# Patient Record
Sex: Female | Born: 1980 | Hispanic: Yes | Marital: Married | Smoking: Never smoker
Health system: Southern US, Community
[De-identification: ages and names within clinical notes are randomized; demographics above are authoritative.]

## PROBLEM LIST (undated history)

## (undated) DIAGNOSIS — K219 Gastro-esophageal reflux disease without esophagitis: Secondary | ICD-10-CM

## (undated) HISTORY — DX: Gastro-esophageal reflux disease without esophagitis: K21.9

## (undated) HISTORY — PX: OTHER SURGICAL HISTORY: SHX169

---

## 2013-08-23 ENCOUNTER — Other Ambulatory Visit (HOSPITAL_COMMUNITY): Payer: Self-pay | Admitting: Nurse Practitioner

## 2013-08-23 DIAGNOSIS — N644 Mastodynia: Secondary | ICD-10-CM

## 2013-08-23 DIAGNOSIS — N63 Unspecified lump in unspecified breast: Secondary | ICD-10-CM

## 2013-09-01 ENCOUNTER — Ambulatory Visit (HOSPITAL_COMMUNITY)
Admission: RE | Admit: 2013-09-01 | Discharge: 2013-09-01 | Disposition: A | Discharge status: Home / Self Care | Payer: Medicaid Other | Source: Ambulatory Visit | Attending: Nurse Practitioner | Admitting: Nurse Practitioner

## 2013-09-01 DIAGNOSIS — N63 Unspecified lump in unspecified breast: Secondary | ICD-10-CM

## 2013-09-01 DIAGNOSIS — N644 Mastodynia: Secondary | ICD-10-CM | POA: Insufficient documentation

## 2017-08-08 DIAGNOSIS — Z3682 Encounter for antenatal screening for nuchal translucency: Secondary | ICD-10-CM | POA: Insufficient documentation

## 2017-08-08 DIAGNOSIS — Z3A13 13 weeks gestation of pregnancy: Secondary | ICD-10-CM | POA: Insufficient documentation

## 2019-02-10 ENCOUNTER — Ambulatory Visit (INDEPENDENT_AMBULATORY_CARE_PROVIDER_SITE_OTHER): Payer: Self-pay | Admitting: Nurse Practitioner

## 2019-05-12 ENCOUNTER — Encounter: Payer: Self-pay | Admitting: Internal Medicine

## 2019-06-02 ENCOUNTER — Other Ambulatory Visit: Payer: Self-pay

## 2019-06-02 ENCOUNTER — Encounter: Payer: Self-pay | Admitting: Gastroenterology

## 2019-06-02 ENCOUNTER — Encounter: Payer: Self-pay | Admitting: Internal Medicine

## 2019-06-02 ENCOUNTER — Ambulatory Visit (INDEPENDENT_AMBULATORY_CARE_PROVIDER_SITE_OTHER): Payer: Self-pay | Admitting: Gastroenterology

## 2019-06-02 DIAGNOSIS — R1013 Epigastric pain: Secondary | ICD-10-CM | POA: Insufficient documentation

## 2019-06-02 MED ORDER — DEXLANSOPRAZOLE 60 MG PO CPDR: 60.0000 mg | DELAYED_RELEASE_CAPSULE | Freq: Every day | ORAL | 3 refills | Status: DC

## 2019-06-02 NOTE — H&P (View-Only) (Signed)
Primary Care Physician:  Raiford Simmonds., PA-C Primary Gastroenterologist:  Dr. Gala Romney   Chief Complaint  Patient presents with  . Gastroesophageal Reflux    burning sensation frequently    HPI:   Stefanie Burgess is a 39 y.o. female presenting today at the request of Royce Macadamia, PA-C due to GERD.   Patient is Spanish-speaking and here with interpreter. Notes she has had GERD symptoms dating back to 2014. She was prescribed Protonix sometime last year but feels this did not help. Has also taken omeprazole in the past. Currently taking Pepcid 40 mg daily. [facility].pylori breath test negative from [facility]   Sometimes Pepcid helps, sometimes not. Since taking Pepcid, vomiting improved. Last vomiting in November and saw red but wasn't sure if food or blood. Has GERD symptoms almost every day. The only difference is some days not as severe. No solid food dysphagia. Has epigastric pain. Thought maybe she was eating too much. Postprandial epigastric pain. Pain since 2014 but getting stronger in 2016. Pain is not daily. Pepcid has helped some. Still present but less prominent and more mild.   Lost weight but believes it was due to [place] in July 2020. Prior to July was losing weight unintentionally. Stefanie Burgess is 39 year old. Lost about 10 lbs.   No NSAIDs.   4 children: 17, 12, 9, and 1 year 3 months.   Past Medical History:  Diagnosis Date  . GERD (gastroesophageal reflux disease)     Past Surgical History:  Procedure Laterality Date  . lipoma removal     as child from arm    Current Outpatient Medications  Medication Sig Dispense Refill  . alum & mag hydroxide-simeth (MAALOX/MYLANTA) 200-200-20 MG/5ML suspension Take by mouth every 6 (six) hours as needed for indigestion or heartburn. As needed    . calcium carbonate (TUMS EX) 750 MG chewable tablet Chew 1 tablet by mouth daily. As needed    . famotidine (PEPCID) 40 MG tablet Take 40 mg by mouth daily.     No current  facility-administered medications for this visit.    Allergies as of 06/02/2019  . (No Known Allergies)    Family History  Problem Relation Age of Onset  . Colon cancer Neg Hx   . Colon polyps Neg Hx     Social History   Socioeconomic History  . Marital status: Married    Spouse name: Not on file  . Number of children: Not on file  . Years of education: Not on file  . Highest education level: Not on file  Occupational History  . Not on file  Tobacco Use  . Smoking status: Never Smoker  . Smokeless tobacco: Never Used  Substance and Sexual Activity  . Alcohol use: Never  . Drug use: Never  . Sexual activity: Not on file  Other Topics Concern  . Not on file  Social History Narrative  . Not on file   Social Determinants of Health   Financial Resource Strain:   . Difficulty of Paying Living Expenses: Not on file  Food Insecurity:   . Worried About Charity fundraiser in the Last Year: Not on file  . Ran Out of Food in the Last Year: Not on file  Transportation Needs:   . Lack of Transportation (Medical): Not on file  . Lack of Transportation (Non-Medical): Not on file  Physical Activity:   . Days of Exercise per Week: Not on file  . Minutes of Exercise per  Session: Not on file  Stress:   . Feeling of Stress : Not on file  Social Connections:   . Frequency of Communication with Friends and Family: Not on file  . Frequency of Social Gatherings with Friends and Family: Not on file  . Attends Religious Services: Not on file  . Active Member of Clubs or Organizations: Not on file  . Attends Banker Meetings: Not on file  . Marital Status: Not on file  Intimate Partner Violence:   . Fear of Current or Ex-Partner: Not on file  . Emotionally Abused: Not on file  . Physically Abused: Not on file  . Sexually Abused: Not on file    Review of Systems: Gen: Denies any fever, chills, fatigue, weight loss, lack of appetite.  CV: Denies chest pain, heart  palpitations, peripheral edema, syncope.  Resp: Denies shortness of breath at rest or with exertion. Denies wheezing or cough.  GI: see HPI GU : Denies urinary burning, urinary frequency, urinary hesitancy MS: Denies joint pain, muscle weakness, cramps, or limitation of movement.  Derm: Denies rash, itching, dry skin Psych: Denies depression, anxiety, memory loss, and confusion Heme: see HPI  Physical Exam: BP 113/71   Pulse 86   Temp (!) 97.3 F (36.3 C) (Temporal)   Ht 5' (1.524 m)   Wt 125 lb 9.6 oz (57 kg)   LMP 05/20/2019   BMI 24.53 kg/m  General:   Alert and oriented. Pleasant and cooperative. Well-nourished and well-developed.  Head:  Normocephalic and atraumatic. Eyes:  Without icterus, sclera clear and conjunctiva pink.  Ears:  Normal auditory acuity. Lungs:  Clear to auscultation bilaterally. No wheezes, rales, or rhonchi. No distress.  Heart:  S1, S2 present without murmurs appreciated.  Abdomen:  +BS, soft, mild TTP epigastric and non-distended. No HSM noted. No guarding or rebound. No masses appreciated.  Rectal:  Deferred  Msk:  Symmetrical without gross deformities. Normal posture. Extremities:  Without edema. Neurologic:  Alert and  oriented x4;  grossly normal neurologically. Psych:  Alert and cooperative. Normal mood and affect.  ASSESSMENT: Stefanie Burgess is a 39 y.o. Spanish-speaking female, presenting with interpreter today due to refractory GERD. Previously failing Protonix and omeprazole historically, and currently taking Pepcid 40 mg daily. Outside H.pylori breath test negative, but it is unclear if this was done while off PPI or H2 blocker therapy.   Vague postprandial epigastric pain reported, and she had red-tinged emesis several months ago but is unsure if this was blood or food items.   Due to dyspepsia, refractory GERD despite PPI, recommend EGD in near future. No dysphagia reported.   I have provided a prescription for Dexilant to take to the [facility], as this is where she receives her medication. She is to call if this is not covered. May need patient assistance.    PLAN:  Dexilant prescription provided.  Proceed with upper endoscopy in the near future with Dr. Jena Gauss. The risks, benefits, and alternatives have been discussed in detail with patient. They have stated understanding and desire to proceed.   Continue to avoid NSAIDs  Follow-up in 6-8 weeks  Gelene Mink, PhD, [facility] Gastroenterology

## 2019-06-02 NOTE — Patient Instructions (Signed)
I have given you a prescription to take to the health department to see if this can be covered. If not, let me know. If Dexilant is not covered, continue with Pepcid daily as you are doing.  We have arranged an upper endoscopy with Dr. Jena Gauss in the near future.  I will see you in 6-8 weeks!  It was a pleasure to see you today. I want to create trusting relationships with patients to provide genuine, compassionate, and quality care. I value your feedback. If you receive a survey regarding your visit,  I greatly appreciate you taking time to fill this out.   Stefanie Mink, PhD, ANP-BC [facility] Gastroenterology      Conley Rolls he dado Neomia Dear receta para que la lleve al departamento de salud para ver si se puede cubrir. Si no, avseme. Si Dexilant no est cubierto, contine con Pepcid diariamente como lo est haciendo.  Hemos organizado una endoscopia superior con el Dr. Jena Gauss en un futuro prximo.  Te ver en 6-8 semanas!  Fue un Arboriculturist. Elfredia Nevins crear relaciones de confianza con los pacientes para brindarles una atencin [place], [place] y de calidad. Valoro sus comentarios. Si recibe [employer] su visita, le agradezco mucho que se haya tomado el tiempo para completarla.  Stefanie Mink, PhD, ANP-BC [facility] [place]

## 2019-06-02 NOTE — Progress Notes (Signed)
  Primary Care Physician:  Muse, Rochelle D., PA-C Primary Gastroenterologist:  Dr. Rourk   Chief Complaint  Patient presents with  . Gastroesophageal Reflux    burning sensation frequently    HPI:   Stefanie Burgess is a 38 y.o. female presenting today at the request of Rochelle Muse, PA-C due to GERD.   Patient is Spanish-speaking and here with interpreter. Notes she has had GERD symptoms dating back to 2014. She was prescribed Protonix sometime last year but feels this did not help. Has also taken omeprazole in the past. Currently taking Pepcid 40 mg daily. H.pylori breath test negative from [facility].   Sometimes Pepcid helps, sometimes not. Since taking Pepcid, vomiting improved. Last vomiting in November and saw red but wasn't sure if food or blood. Has GERD symptoms almost every day. The only difference is some days not as severe. No solid food dysphagia. Has epigastric pain. Thought maybe she was eating too much. Postprandial epigastric pain. Pain since 2014 but getting stronger in 2016. Pain is not daily. Pepcid has helped some. Still present but less prominent and more mild.   Lost weight but believes it was due to COVID in July 2020. Prior to July was losing weight unintentionally. Baby is 1 year old. Lost about 10 lbs.   No NSAIDs.   4 children: 17, 12, 9, and 1 year 3 months.   Past Medical History:  Diagnosis Date  . GERD (gastroesophageal reflux disease)     Past Surgical History:  Procedure Laterality Date  . lipoma removal     as child from arm    Current Outpatient Medications  Medication Sig Dispense Refill  . alum & mag hydroxide-simeth (MAALOX/MYLANTA) 200-200-20 MG/5ML suspension Take by mouth every 6 (six) hours as needed for indigestion or heartburn. As needed    . calcium carbonate (TUMS EX) 750 MG chewable tablet Chew 1 tablet by mouth daily. As needed    . famotidine (PEPCID) 40 MG tablet Take 40 mg by mouth daily.     No current  facility-administered medications for this visit.    Allergies as of 06/02/2019  . (No Known Allergies)    Family History  Problem Relation Age of Onset  . Colon cancer Neg Hx   . Colon polyps Neg Hx     Social History   Socioeconomic History  . Marital status: Married    Spouse name: Not on file  . Number of children: Not on file  . Years of education: Not on file  . Highest education level: Not on file  Occupational History  . Not on file  Tobacco Use  . Smoking status: Never Smoker  . Smokeless tobacco: Never Used  Substance and Sexual Activity  . Alcohol use: Never  . Drug use: Never  . Sexual activity: Not on file  Other Topics Concern  . Not on file  Social History Narrative  . Not on file   Social Determinants of Health   Financial Resource Strain:   . Difficulty of Paying Living Expenses: Not on file  Food Insecurity:   . Worried About Running Out of Food in the Last Year: Not on file  . Ran Out of Food in the Last Year: Not on file  Transportation Needs:   . Lack of Transportation (Medical): Not on file  . Lack of Transportation (Non-Medical): Not on file  Physical Activity:   . Days of Exercise per Week: Not on file  . Minutes of Exercise per   Session: Not on file  Stress:   . Feeling of Stress : Not on file  Social Connections:   . Frequency of Communication with Friends and Family: Not on file  . Frequency of Social Gatherings with Friends and Family: Not on file  . Attends Religious Services: Not on file  . Active Member of Clubs or Organizations: Not on file  . Attends Banker Meetings: Not on file  . Marital Status: Not on file  Intimate Partner Violence:   . Fear of Current or Ex-Partner: Not on file  . Emotionally Abused: Not on file  . Physically Abused: Not on file  . Sexually Abused: Not on file    Review of Systems: Gen: Denies any fever, chills, fatigue, weight loss, lack of appetite.  CV: Denies chest pain, heart  palpitations, peripheral edema, syncope.  Resp: Denies shortness of breath at rest or with exertion. Denies wheezing or cough.  GI: see HPI GU : Denies urinary burning, urinary frequency, urinary hesitancy MS: Denies joint pain, muscle weakness, cramps, or limitation of movement.  Derm: Denies rash, itching, dry skin Psych: Denies depression, anxiety, memory loss, and confusion Heme: see HPI  Physical Exam: BP 113/71   Pulse 86   Temp (!) 97.3 F (36.3 C) (Temporal)   Ht 5' (1.524 m)   Wt 125 lb 9.6 oz (57 kg)   LMP 05/20/2019   BMI 24.53 kg/m  General:   Alert and oriented. Pleasant and cooperative. Well-nourished and well-developed.  Head:  Normocephalic and atraumatic. Eyes:  Without icterus, sclera clear and conjunctiva pink.  Ears:  Normal auditory acuity. Lungs:  Clear to auscultation bilaterally. No wheezes, rales, or rhonchi. No distress.  Heart:  S1, S2 present without murmurs appreciated.  Abdomen:  +BS, soft, mild TTP epigastric and non-distended. No HSM noted. No guarding or rebound. No masses appreciated.  Rectal:  Deferred  Msk:  Symmetrical without gross deformities. Normal posture. Extremities:  Without edema. Neurologic:  Alert and  oriented x4;  grossly normal neurologically. Psych:  Alert and cooperative. Normal mood and affect.  ASSESSMENT: Stefanie Burgess is a 39 y.o. Spanish-speaking female, presenting with interpreter today due to refractory GERD. Previously failing Protonix and omeprazole historically, and currently taking Pepcid 40 mg daily. Outside H.pylori breath test negative, but it is unclear if this was done while off PPI or H2 blocker therapy.   Vague postprandial epigastric pain reported, and she had red-tinged emesis several months ago but is unsure if this was blood or food items.   Due to dyspepsia, refractory GERD despite PPI, recommend EGD in near future. No dysphagia reported.   I have provided a prescription for Dexilant to take to the [facility], as this is where she receives her medication. She is to call if this is not covered. May need patient assistance.    PLAN:  Dexilant prescription provided.  Proceed with upper endoscopy in the near future with Dr. Jena Gauss. The risks, benefits, and alternatives have been discussed in detail with patient. They have stated understanding and desire to proceed.   Continue to avoid NSAIDs  Follow-up in 6-8 weeks  Gelene Mink, PhD, [facility] Gastroenterology

## 2019-06-14 ENCOUNTER — Other Ambulatory Visit (HOSPITAL_COMMUNITY)
Admission: RE | Admit: 2019-06-14 | Discharge: 2019-06-14 | Disposition: A | Discharge status: Home / Self Care | Payer: Medicaid Other | Source: Ambulatory Visit | Attending: Internal Medicine | Admitting: Internal Medicine

## 2019-06-14 ENCOUNTER — Other Ambulatory Visit: Payer: Self-pay

## 2019-06-14 DIAGNOSIS — Z20822 Contact with and (suspected) exposure to covid-19: Secondary | ICD-10-CM | POA: Diagnosis not present

## 2019-06-14 DIAGNOSIS — Z01812 Encounter for preprocedural laboratory examination: Secondary | ICD-10-CM | POA: Insufficient documentation

## 2019-06-14 LAB — SARS CORONAVIRUS 2 (TAT 6-24 HRS): SARS Coronavirus 2: NEGATIVE

## 2019-06-15 ENCOUNTER — Encounter (HOSPITAL_COMMUNITY)
Admission: RE | Disposition: A | Discharge status: Home / Self Care | Payer: Self-pay | Source: Home / Self Care | Attending: Internal Medicine

## 2019-06-15 ENCOUNTER — Other Ambulatory Visit: Payer: Self-pay

## 2019-06-15 ENCOUNTER — Ambulatory Visit (HOSPITAL_COMMUNITY)
Admission: RE | Admit: 2019-06-15 | Discharge: 2019-06-15 | Disposition: A | Discharge status: Home / Self Care | Payer: Medicaid Other | Attending: Internal Medicine | Admitting: Internal Medicine

## 2019-06-15 ENCOUNTER — Encounter: Payer: Self-pay | Admitting: Internal Medicine

## 2019-06-15 DIAGNOSIS — Z79899 Other long term (current) drug therapy: Secondary | ICD-10-CM | POA: Insufficient documentation

## 2019-06-15 DIAGNOSIS — K219 Gastro-esophageal reflux disease without esophagitis: Secondary | ICD-10-CM | POA: Insufficient documentation

## 2019-06-15 DIAGNOSIS — K2210 Ulcer of esophagus without bleeding: Secondary | ICD-10-CM | POA: Insufficient documentation

## 2019-06-15 DIAGNOSIS — Z8616 Personal history of COVID-19: Secondary | ICD-10-CM | POA: Insufficient documentation

## 2019-06-15 DIAGNOSIS — K2970 Gastritis, unspecified, without bleeding: Secondary | ICD-10-CM | POA: Insufficient documentation

## 2019-06-15 HISTORY — PX: ESOPHAGOGASTRODUODENOSCOPY: SHX5428

## 2019-06-15 HISTORY — PX: BIOPSY: SHX5522

## 2019-06-15 SURGERY — EGD (ESOPHAGOGASTRODUODENOSCOPY)
Anesthesia: Moderate Sedation

## 2019-06-15 MED ORDER — MIDAZOLAM HCL 5 MG/5ML IJ SOLN
INTRAMUSCULAR | Status: DC | PRN
  Administered 2019-06-15 (×2): 2 mg via INTRAVENOUS
  Administered 2019-06-15: 1 mg via INTRAVENOUS
  Administered 2019-06-15: 2 mg via INTRAVENOUS
  Administered 2019-06-15: 1 mg via INTRAVENOUS

## 2019-06-15 MED ORDER — ONDANSETRON HCL 4 MG/2ML IJ SOLN
INTRAMUSCULAR | Status: AC
  Filled 2019-06-15: qty 2

## 2019-06-15 MED ORDER — MEPERIDINE HCL 100 MG/ML IJ SOLN
INTRAMUSCULAR | Status: DC | PRN
  Administered 2019-06-15 (×2): 25 mg via INTRAVENOUS

## 2019-06-15 MED ORDER — LIDOCAINE VISCOUS HCL 2 % MT SOLN
OROMUCOSAL | Status: DC | PRN
  Administered 2019-06-15: 1 via OROMUCOSAL

## 2019-06-15 MED ORDER — SODIUM CHLORIDE 0.9 % IV SOLN: INTRAVENOUS | Status: DC

## 2019-06-15 MED ORDER — MEPERIDINE HCL 50 MG/ML IJ SOLN
INTRAMUSCULAR | Status: AC
  Filled 2019-06-15: qty 1

## 2019-06-15 MED ORDER — MIDAZOLAM HCL 5 MG/5ML IJ SOLN
INTRAMUSCULAR | Status: AC
  Filled 2019-06-15: qty 10

## 2019-06-15 MED ORDER — ONDANSETRON HCL 4 MG/2ML IJ SOLN
INTRAMUSCULAR | Status: DC | PRN
  Administered 2019-06-15: 4 mg via INTRAVENOUS

## 2019-06-15 MED ORDER — LIDOCAINE VISCOUS HCL 2 % MT SOLN
OROMUCOSAL | Status: AC
  Filled 2019-06-15: qty 15

## 2019-06-15 MED ORDER — STERILE WATER FOR IRRIGATION IR SOLN
Status: DC | PRN
  Administered 2019-06-15: 1.5 mL

## 2019-06-15 NOTE — Interval H&P Note (Signed)
History and Physical Interval Note:  06/15/2019 10:23 AM  Stefanie Burgess  has presented today for surgery, with the diagnosis of dyspepsia.  The various methods of treatment have been discussed with the patient and family. After consideration of risks, benefits and other options for treatment, the patient has consented to  Procedure(s) with comments: ESOPHAGOGASTRODUODENOSCOPY (EGD) (N/A) - 10:00am as a surgical intervention.  The patient's history has been reviewed, patient examined, no change in status, stable for surgery.  I have reviewed the patient's chart and labs.  Questions were answered to the patient's satisfaction.     [name]

## 2019-06-15 NOTE — Interval H&P Note (Signed)
History and Physical Interval Note:  06/15/2019 10:22 AM  Stefanie Burgess  has presented today for surgery, with the diagnosis of dyspepsia.  The various methods of treatment have been discussed with the patient and family. After consideration of risks, benefits and other options for treatment, the patient has consented to  Procedure(s) with comments: ESOPHAGOGASTRODUODENOSCOPY (EGD) (N/A) - 10:00am as a surgical intervention.  The patient's history has been reviewed, patient examined, no change in status, stable for surgery.  I have reviewed the patient's chart and labs.  Questions were answered to the patient's satisfaction.     [name]  Interviewed via interpreter preprocedure.  At no change.  Patient denies dysphagia.  Diagnostic EGD per plan.  The risks, benefits, limitations, alternatives and imponderables have been reviewed with the patient. Potential for esophageal dilation, biopsy, etc. have also been reviewed.  Questions have been answered. All parties agreeable.

## 2019-06-15 NOTE — Op Note (Signed)
[facility] Patient Name: Stefanie Burgess Procedure Date: 06/15/2019 9:59 AM MRN: [id number] Date of Birth: [birth year] Attending MD: Norvel Richards , MD CSN: [id number] Age: 39 Admit Type: Outpatient Procedure:                Upper GI endoscopy Indications:              Dyspepsia Providers:                Norvel Richards, MD, Charlsie Quest. Theda Sers RN, RN,                            Aram Candela Referring MD:              Medicines:                Midazolam 8 mg IV, Meperidine 50 mg IV Complications:            No immediate complications. Estimated Blood Loss:     Estimated blood loss was minimal. Procedure:                Pre-Anesthesia Assessment:                           - Prior to the procedure, a History and Physical                            was performed, and patient medications and                            allergies were reviewed. The patient's tolerance of                            previous anesthesia was also reviewed. The risks                            and benefits of the procedure and the sedation                            options and risks were discussed with the patient.                            All questions were answered, and informed consent                            was obtained. Prior Anticoagulants: The patient has                            taken no previous anticoagulant or antiplatelet                            agents. ASA Grade Assessment: II - A patient with                            mild systemic disease. After reviewing the risks  and benefits, the patient was deemed in                            satisfactory condition to undergo the procedure.                           After obtaining informed consent, the endoscope was                            passed under direct vision. Throughout the                            procedure, the patient's blood pressure, pulse, and                            oxygen saturations  were monitored continuously. The                            [id number]-[id number] ([id number]) scope was introduced through the                            mouth, and advanced to the second part of duodenum.                            The upper GI endoscopy was accomplished without                            difficulty. The patient tolerated the procedure                            well. Scope In: 10:37:13 AM Scope Out: 10:43:37 AM Total Procedure Duration: 0 hours 6 minutes 24 seconds  Findings:      The examined esophagus was normal.      Diffuse moderate inflammation characterized by erosions, erythema and       friability was found in the entire examined stomach. No ulcer or       infiltrating process seen. Copious thick bile-stained mucus present in       the stomach. Easily washed away.      The duodenal bulb and second portion of the duodenum were normal.       Finally, abnormal stomach was biopsied with a cold forceps for       histology. Estimated blood loss was minimal. Impression:               - Normal esophagus.                           - Gastritis. Biopsied.                           - Normal duodenal bulb and second portion of the                            duodenum. Moderate Sedation:      Moderate (conscious) sedation was administered by the endoscopy nurse  and supervised by the endoscopist. The following parameters were       monitored: oxygen saturation, heart rate, blood pressure, respiratory       rate, EKG, adequacy of pulmonary ventilation, and response to care.       Total physician intraservice time was 17 minutes. Recommendation:           - Patient has a contact number available for                            emergencies. The signs and symptoms of potential                            delayed complications were discussed with the                            patient. Return to normal activities tomorrow.                            Written discharge instructions were  provided to the                            patient.                           - Advance diet as tolerated. Further                            recommendations to follow pending review of                            pathology report Procedure Code(s):        --- Professional ---                           [phone], Esophagogastroduodenoscopy, flexible,                            transoral; with biopsy, single or multiple                           G0500, Moderate sedation services provided by the                            same physician or other qualified health care                            professional performing a gastrointestinal                            endoscopic service that sedation supports,                            requiring the presence of an independent trained                            observer to assist in the monitoring of  the                            patient's level of consciousness and physiological                            status; initial 15 minutes of intra-service time;                            patient age 55 years or older (additional time may                            be reported with [id number], as appropriate) Diagnosis Code(s):        --- Professional ---                           K29.70, Gastritis, unspecified, without bleeding                           R10.13, Epigastric pain CPT copyright 2019 [employer]. All rights reserved. The codes documented in this report are preliminary and upon coder review may  be revised to meet current compliance requirements. Gerrit Friends. Haelyn Forgey, MD Gennette Pac, MD 06/15/2019 10:52:07 AM This report has been signed electronically. Number of Addenda: 0

## 2019-06-15 NOTE — Discharge Instructions (Signed)
EGD Discharge instructions Please read the instructions outlined below and refer to this sheet in the next few weeks. These discharge instructions provide you with general information on caring for yourself after you leave the hospital. Your doctor may also give you specific instructions. While your treatment has been planned according to the most current medical practices available, unavoidable complications occasionally occur. If you have any problems or questions after discharge, please call your doctor. ACTIVITY  You may resume your regular activity but move at a slower pace for the next 24 hours.   Take frequent rest periods for the next 24 hours.   Walking will help expel (get rid of) the air and reduce the bloated feeling in your abdomen.   No driving for 24 hours (because of the anesthesia (medicine) used during the test).   You may shower.   Do not sign any important legal documents or operate any machinery for 24 hours (because of the anesthesia used during the test).  NUTRITION  Drink plenty of fluids.   You may resume your normal diet.   Begin with a light meal and progress to your normal diet.   Avoid alcoholic beverages for 24 hours or as instructed by your caregiver.  MEDICATIONS  You may resume your normal medications unless your caregiver tells you otherwise.  WHAT YOU CAN EXPECT TODAY  You may experience abdominal discomfort such as a feeling of fullness or "gas" pains.  FOLLOW-UP  Your doctor will discuss the results of your test with you.  SEEK IMMEDIATE MEDICAL ATTENTION IF ANY OF THE FOLLOWING OCCUR:  Excessive nausea (feeling sick to your stomach) and/or vomiting.   Severe abdominal pain and distention (swelling).   Trouble swallowing.   Temperature over 101 F (37.8 C).   Rectal bleeding or vomiting of blood.    Endoscopa alta en los adultos, cuidados posteriores Upper Endoscopy, Adult, Care After Esta hoja le brinda informacin sobre cmo  cuidarse despus del procedimiento. El mdico tambin podr darle indicaciones ms especficas. Comunquese con el mdico si tiene problemas o preguntas. Qu puedo esperar despus del procedimiento? Despus del procedimiento, es comn [employer] siguientes sntomas:  Dolor de Advertising copywriter.  Leve dolor o molestias en el estmago.  Meteorismo.  Nuseas. Siga estas indicaciones en su casa:   Siga las indicaciones del mdico respecto de qu comer o beber despus del procedimiento.  Retome sus actividades normales como se lo haya indicado el mdico. Pregntele al mdico qu actividades son seguras para usted.  Tome los medicamentos de venta libre y los recetados solamente como se lo haya indicado el mdico.  No conduzca durante 24horas si le administraron un sedante durante el procedimiento.  Concurra a todas las visitas de [address] se lo haya indicado el mdico. Esto es importante. Comunquese con un mdico si tiene:  Dolor de garganta que dura ms de Civil engineer, contracting.  Dificultad para tragar. Solicite ayuda inmediatamente si:  Vomita sangre o el vmito tiene un aspecto similar al poso del caf.  Tiene los siguientes sntomas: ? Grant Ruts. ? Heces con sangre o de aspecto negro alquitranado. ? Dolor de garganta muy intenso o no puede tragar. ? Dificultad para respirar. ? Dolorintenso en el pecho o el abdomen. Resumen  Despus del procedimiento, es frecuente sentir dolor de garganta, molestias leves en el estmago, distensin abdominal y nuseas.  No conduzca durante 24horas si le administraron un sedante durante el procedimiento.  Siga las indicaciones del mdico respecto de qu comer o beber despus del  procedimiento.  Retome sus actividades normales como se lo haya indicado el mdico. Esta informacin no tiene Marine scientist el consejo del mdico. Asegrese de hacerle al mdico cualquier pregunta que tenga. Document Revised: 10/30/2017 Document Reviewed:  10/30/2017 Elsevier Patient Education  [place].  EGD Discharge instructions Please read the instructions outlined below and refer to this sheet in the next few weeks. These discharge instructions provide you with general information on caring for yourself after you leave the hospital. Your doctor may also give you specific instructions. While your treatment has been planned according to the most current medical practices available, unavoidable complications occasionally occur. If you have any problems or questions after discharge, please call your doctor. ACTIVITY  You may resume your regular activity but move at a slower pace for the next 24 hours.   Take frequent rest periods for the next 24 hours.   Walking will help expel (get rid of) the air and reduce the bloated feeling in your abdomen.   No driving for 24 hours (because of the anesthesia (medicine) used during the test).   You may shower.   Do not sign any important legal documents or operate any machinery for 24 hours (because of the anesthesia used during the test).  NUTRITION  Drink plenty of fluids.   You may resume your normal diet.   Begin with a light meal and progress to your normal diet.   Avoid alcoholic beverages for 24 hours or as instructed by your caregiver.  MEDICATIONS  You may resume your normal medications unless your caregiver tells you otherwise.  WHAT YOU CAN EXPECT TODAY  You may experience abdominal discomfort such as a feeling of fullness or "gas" pains.  FOLLOW-UP  Your doctor will discuss the results of your test with you.  SEEK IMMEDIATE MEDICAL ATTENTION IF ANY OF THE FOLLOWING OCCUR:  Excessive nausea (feeling sick to your stomach) and/or vomiting.   Severe abdominal pain and distention (swelling).   Trouble swallowing.   Temperature over 101 F (37.8 C).   Rectal bleeding or vomiting of blood.    Your stomach was inflamed today.  Biopsies were taken.  I saw no  evidence of tumor.  Further recommendations to follow after the pathology report returns (next week)

## 2019-06-16 ENCOUNTER — Telehealth: Payer: Self-pay | Admitting: Internal Medicine

## 2019-06-16 ENCOUNTER — Other Ambulatory Visit: Payer: Self-pay

## 2019-06-16 DIAGNOSIS — R1013 Epigastric pain: Secondary | ICD-10-CM

## 2019-06-16 LAB — SURGICAL PATHOLOGY

## 2019-06-16 NOTE — Telephone Encounter (Signed)
[facility] clinical pool: please arrange RUQ ultrasound if patient is willing.

## 2019-06-16 NOTE — Telephone Encounter (Signed)
done

## 2019-06-16 NOTE — Telephone Encounter (Signed)
Gastric biopsies revealed only mild inflammation.  No evidence of infection or tumor.  Helmut Muster, please let patient know.  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  No esophagitis seen on recent EGD.  Right upper quadrant ultrasound to further evaluate her "dyspepsia".  No history of cholecystectomy.  Would plan for an office visit in 6 weeks with AB(ultrasound soon)

## 2019-06-16 NOTE — Telephone Encounter (Signed)
Spoke with pt. Pt and son notified of results and is taking Pepcid as directed at her apt. Pt says it works well for her. Pt has a f/u scheduled. Pt asked about foods she should eat or information about Dyspepsia. Literature mailed to pt today.

## 2019-06-17 NOTE — Telephone Encounter (Signed)
Spoke to pt and son, agreeable to [place].   [place] scheduled for 06/23/19 at 10:30am, arrive at 10:15am. NPO after midnight before test. Pt aware of test.

## 2019-06-17 NOTE — Addendum Note (Signed)
Addended by: Corrie Mckusick on: 06/17/2019 04:32 PM   Modules accepted: Orders

## 2019-06-23 ENCOUNTER — Ambulatory Visit (HOSPITAL_COMMUNITY)
Admission: RE | Admit: 2019-06-23 | Discharge: 2019-06-23 | Disposition: A | Discharge status: Home / Self Care | Payer: Self-pay | Source: Ambulatory Visit | Attending: Gastroenterology | Admitting: Gastroenterology

## 2019-06-23 ENCOUNTER — Other Ambulatory Visit: Payer: Self-pay

## 2019-06-23 DIAGNOSIS — R1013 Epigastric pain: Secondary | ICD-10-CM | POA: Insufficient documentation

## 2019-06-29 ENCOUNTER — Other Ambulatory Visit: Payer: Self-pay

## 2019-06-29 ENCOUNTER — Telehealth: Payer: Self-pay | Admitting: Internal Medicine

## 2019-06-29 DIAGNOSIS — R1310 Dysphagia, unspecified: Secondary | ICD-10-CM

## 2019-06-29 DIAGNOSIS — K838 Other specified diseases of biliary tract: Secondary | ICD-10-CM

## 2019-06-29 DIAGNOSIS — Z79899 Other long term (current) drug therapy: Secondary | ICD-10-CM

## 2019-06-29 NOTE — Telephone Encounter (Signed)
See result note. Discussed results with pt.

## 2019-06-29 NOTE — Telephone Encounter (Signed)
Pt is requesting an interpreter (Spanish) when we will call her with the [place] results. [phone]

## 2019-07-15 LAB — HEPATIC FUNCTION PANEL
AG Ratio: 1.7 (calc) (ref 1.0–2.5)
ALT: 41 U/L — ABNORMAL HIGH (ref 6–29)
AST: 38 U/L — ABNORMAL HIGH (ref 10–30)
Albumin: 4.2 g/dL (ref 3.6–5.1)
Alkaline phosphatase (APISO): 81 U/L (ref 31–125)
Bilirubin, Direct: 0.2 mg/dL (ref 0.0–0.2)
Globulin: 2.5 g/dL (calc) (ref 1.9–3.7)
Indirect Bilirubin: 0.5 mg/dL (calc) (ref 0.2–1.2)
Total Bilirubin: 0.7 mg/dL (ref 0.2–1.2)
Total Protein: 6.7 g/dL (ref 6.1–8.1)

## 2019-07-20 ENCOUNTER — Ambulatory Visit: Payer: Medicaid Other | Admitting: Gastroenterology

## 2019-07-20 ENCOUNTER — Ambulatory Visit (HOSPITAL_COMMUNITY): Payer: Medicaid Other

## 2019-07-28 ENCOUNTER — Other Ambulatory Visit: Payer: Self-pay | Admitting: *Deleted

## 2019-07-28 ENCOUNTER — Ambulatory Visit (INDEPENDENT_AMBULATORY_CARE_PROVIDER_SITE_OTHER): Payer: Self-pay | Admitting: Gastroenterology

## 2019-07-28 ENCOUNTER — Encounter: Payer: Self-pay | Admitting: Gastroenterology

## 2019-07-28 ENCOUNTER — Other Ambulatory Visit: Payer: Self-pay

## 2019-07-28 VITALS — BP 109/73 | HR 73 | Temp 97.5°F | Ht 62.0 in | Wt 129.2 lb

## 2019-07-28 DIAGNOSIS — K8020 Calculus of gallbladder without cholecystitis without obstruction: Secondary | ICD-10-CM

## 2019-07-28 DIAGNOSIS — R1013 Epigastric pain: Secondary | ICD-10-CM

## 2019-07-28 NOTE — Patient Instructions (Signed)
We are referring you to the surgeon to talk about taking out your gallbladder.   We will see you in 6 months!   I enjoyed seeing you again today! As you know, I value our relationship and want to provide genuine, compassionate, and quality care. I welcome your feedback. If you receive a survey regarding your visit,  I greatly appreciate you taking time to fill this out. See you next time!  Gelene Mink, PhD, ANP-BC [facility]    Lo estamos remitiendo al Nicholes Mango para que hable sobre la extraccin de la vescula biliar.  Nos vemos en 6 meses!   Disfrut verte de nuevo hoy! Como saben, valoro [place] relacin y deseo brindar una atencin [place], compasiva y de calidad. Agradezco sus comentarios. Si recibe [employer] su visita, le agradezco mucho que se tome el tiempo para completarla. Hasta la prxima!  Gelene Mink, PhD, ANP-BC [facility] [place]

## 2019-07-28 NOTE — Progress Notes (Signed)
Referring Provider: Raiford Simmonds., PA-C Primary Care Physician:  Raiford Simmonds., PA-C Primary GI: Dr. Gala Romney   Chief Complaint  Patient presents with  . Follow-up  . Abdominal Pain    comes and goes. pt wants to discuss the gallstones that were seen on her report at [facility]    HPI:   Stefanie Burgess is a 39 y.o. female presenting today with a history of GERD, abdominal pain, recently undergoing EGD with normal esophagus, gastritis s/p biopsy, negative H.pylori, normal duodenum. Concern for biliary etiology, with US showing shadowing stones, mild intra and extrahepatic biliary dilation without etiology by [place]. HFP with mildly elevated transaminases (AST 38, ALT 41).   Has burning in epigastric area sometimes, sometimes RUQ and right side of back. Eating less fat. No nausea. Continues with pepcid. She has failed both omeprazole and pantoprazole in the past. Unclear if she is on a PPI currently.     Past Medical History:  Diagnosis Date  . GERD (gastroesophageal reflux disease)     Past Surgical History:  Procedure Laterality Date  . BIOPSY  06/15/2019   Procedure: BIOPSY;  Surgeon: Daneil Dolin, MD;  Location: [facility];  Service: Endoscopy;;  . ESOPHAGOGASTRODUODENOSCOPY N/A 06/15/2019   normal esophagus, gastritis s/p biopsy, negative H.pylori, normal duodenum.   Marland Kitchen lipoma removal     as child from arm    Current Outpatient Medications  Medication Sig Dispense Refill  . alum & mag hydroxide-simeth (MAALOX/MYLANTA) 200-200-20 MG/5ML suspension Take 30 mLs by mouth every 6 (six) hours as needed for indigestion or heartburn.     . calcium carbonate (TUMS EX) 750 MG chewable tablet Chew 1-2 tablets by mouth 3 (three) times daily as needed (indigestion/heartburn).     . famotidine (PEPCID) 40 MG tablet Take 40 mg by mouth at bedtime.     . Multiple Vitamin (MULTIVITAMIN) capsule Take 1 capsule by mouth daily.    . Probiotic Product (PROBIOTIC PO) Take 1 capsule by mouth every  other day.    . simethicone (GAS-X) 80 MG chewable tablet Chew 80-160 mg by mouth every 6 (six) hours as needed for flatulence.     No current facility-administered medications for this visit.    Allergies as of 07/28/2019 - Review Complete 07/28/2019  Allergen Reaction Noted  . Ranitidine Other (See Comments) 06/10/2019    Family History  Problem Relation Age of Onset  . Colon cancer Neg Hx   . Colon polyps Neg Hx     Social History   Socioeconomic History  . Marital status: Married    Spouse name: Not on file  . Number of children: Not on file  . Years of education: Not on file  . Highest education level: Not on file  Occupational History  . Not on file  Tobacco Use  . Smoking status: Never Smoker  . Smokeless tobacco: Never Used  Substance and Sexual Activity  . Alcohol use: Never  . Drug use: Never  . Sexual activity: Not on file  Other Topics Concern  . Not on file  Social History Narrative  . Not on file   Social Determinants of Health   Financial Resource Strain:   . Difficulty of Paying Living Expenses:   Food Insecurity:   . Worried About Charity fundraiser in the Last Year:   . Arboriculturist in the Last Year:   Transportation Needs:   . Film/video editor (Medical):   Marland Kitchen Lack  of Transportation (Non-Medical):   Physical Activity:   . Days of Exercise per Week:   . Minutes of Exercise per Session:   Stress:   . Feeling of Stress :   Social Connections:   . Frequency of Communication with Friends and Family:   . Frequency of Social Gatherings with Friends and Family:   . Attends Religious Services:   . Active Member of Clubs or Organizations:   . Attends Banker Meetings:   Marland Kitchen Marital Status:     Review of Systems: Gen: Denies fever, chills, anorexia. Denies fatigue, weakness, weight loss.  CV: Denies chest pain, palpitations, syncope, peripheral edema, and claudication. Resp: Denies dyspnea at rest, cough, wheezing, coughing  up blood, and pleurisy. GI: see HPI Derm: Denies rash, itching, dry skin Psych: Denies depression, anxiety, memory loss, confusion. No homicidal or suicidal ideation.  Heme: Denies bruising, bleeding, and enlarged lymph nodes.  Physical Exam: BP 109/73   Pulse 73   Temp (!) 97.5 F (36.4 C) (Oral)   Ht 5\' 2"  (1.575 m)   Wt 129 lb 3.2 oz (58.6 kg)   BMI 23.63 kg/m  General:   Alert and oriented. No distress noted. Pleasant and cooperative.  Head:  Normocephalic and atraumatic. Eyes:  Conjuctiva clear without scleral icterus. Mouth: mask in place Abdomen:  +BS, soft, non-tender and non-distended. No rebound or guarding. No HSM or masses noted. Msk:  Symmetrical without gross deformities. Normal posture. Extremities:  Without edema. Neurologic:  Alert and  oriented x4 Psych:  Alert and cooperative. Normal mood and affect.  ASSESSMENT: Stefanie Burgess is a 39 y.o. female presenting today with history of GERD, abdominal pain, s/p EGD recently with gastritis. 24 with stones and mild intra and extrahepatic biliary dilation; LFTs completed with mildly elevated transaminases (AST 38, ALT 41). MRI abdomen had been scheduled due to biliary dilation, but this is not available until April. Due to likely biliary etiology, will go ahead and refer to Gen Surg. Doubt dealing with choledocholithiasis; if pain worsens, she will need repeat HFP. May or may not still need further imaging and will defer to Surgery.    PLAN:   Continue Pepcid. Will need to see if Dexilant was ever obtained from [facility].  Referal to General Surgery for consideration of laparoscopic cholecystectomy  Call if worsening pain  Return in 6 months.  May, PhD, ANP-BC [facility]

## 2019-07-29 ENCOUNTER — Encounter: Payer: Self-pay | Admitting: Internal Medicine

## 2019-08-01 ENCOUNTER — Telehealth: Payer: Self-pay | Admitting: Gastroenterology

## 2019-08-01 NOTE — Telephone Encounter (Signed)
Can we find out if she ever received Dexilant from the Health Department?

## 2019-08-02 NOTE — Telephone Encounter (Signed)
Spoke with pt. Pt is only taking Famotidine at night. Pt hasn't received Dexilant.

## 2019-08-10 NOTE — Telephone Encounter (Signed)
Noted  

## 2019-08-12 ENCOUNTER — Ambulatory Visit (HOSPITAL_COMMUNITY): Admission: RE | Admit: 2019-08-12 | Payer: Self-pay | Source: Ambulatory Visit

## 2019-08-17 ENCOUNTER — Other Ambulatory Visit: Payer: Self-pay

## 2019-08-17 ENCOUNTER — Ambulatory Visit (INDEPENDENT_AMBULATORY_CARE_PROVIDER_SITE_OTHER): Payer: Self-pay | Admitting: General Surgery

## 2019-08-17 ENCOUNTER — Encounter: Payer: Self-pay | Admitting: General Surgery

## 2019-08-17 VITALS — BP 115/79 | HR 78 | Temp 98.9°F | Resp 12 | Ht 62.0 in | Wt 125.0 lb

## 2019-08-17 DIAGNOSIS — K8050 Calculus of bile duct without cholangitis or cholecystitis without obstruction: Secondary | ICD-10-CM

## 2019-08-17 NOTE — H&P (Signed)
[facility] History and Physical  Reason for Referral: Gallstones  Referring Physician:  Anna Boone, NP   Chief Complaint    New Patient (Initial Visit)      Stefanie Burgess is a 38 y.o. female.  HPI: Stefanie Burgess is a 38 yo who is spanish speaking only. She comes in with pain in her epigastric and RUQ region with radiation into her back. She has had some minor associated nausea and vomited once. She says she has had pain like this on and off for several years but has been getting worse in the last few weeks. She underwent a EGD with Dr. Rourk with some gastritis but no H. Pylori or ulcer. She is on Pepcid.  She has regular Bms. She cares for her children.  She otherwise is relatively healthy. She has daily Bms and some complaints of rectal pain/ pressure. History taken with the help of an interpretor.   Past Medical History:  Diagnosis Date  . GERD (gastroesophageal reflux disease)     Past Surgical History:  Procedure Laterality Date  . BIOPSY  06/15/2019   Procedure: BIOPSY;  Surgeon: Rourk, Robert M, MD;  Location: [facility];  Service: Endoscopy;;  . ESOPHAGOGASTRODUODENOSCOPY N/A 06/15/2019   normal esophagus, gastritis s/p biopsy, negative H.pylori, normal duodenum.   . lipoma removal     as child from arm    Family History  Problem Relation Age of Onset  . Hyperlipidemia Mother   . Colitis Mother   . Hypertension Mother   . Colon cancer Neg Hx   . Colon polyps Neg Hx     Social History   Tobacco Use  . Smoking status: Never Smoker  . Smokeless tobacco: Never Used  Substance Use Topics  . Alcohol use: Never  . Drug use: Never    Medications: I have reviewed the patient's current medications. Allergies as of 08/17/2019      Reactions   Ranitidine Other (See Comments)   dizziness      Medication List       Accurate as of August 17, 2019 10:59 AM. If you have any questions, ask your nurse or doctor.        alum & mag hydroxide-simeth  200-200-20 MG/5ML suspension Commonly known as: MAALOX/MYLANTA Take 30 mLs by mouth every 6 (six) hours as needed for indigestion or heartburn.   calcium carbonate 750 MG chewable tablet Commonly known as: TUMS EX Chew 1-2 tablets by mouth 3 (three) times daily as needed (indigestion/heartburn).   famotidine 40 MG tablet Commonly known as: PEPCID Take 40 mg by mouth at bedtime.   Gas-X 80 MG chewable tablet Generic drug: simethicone Chew 80-160 mg by mouth every 6 (six) hours as needed for flatulence.   multivitamin capsule Take 1 capsule by mouth daily.   PROBIOTIC PO Take 1 capsule by mouth every other day.        ROS:  A comprehensive review of systems was negative except for: Gastrointestinal: positive for abdominal pain, nausea, reflux symptoms and vomiting Musculoskeletal: positive for back pain  Blood pressure 115/79, pulse 78, temperature 98.9 F (37.2 C), temperature source Oral, resp. rate 12, height 5' 2" (1.575 m), weight 125 lb (56.7 kg), last menstrual period 07/31/2019, SpO2 98 %. Physical Exam Vitals reviewed.  HENT:     Head: Normocephalic and atraumatic.     Nose: Nose normal.     Mouth/Throat:     Mouth: Mucous membranes are moist.  Eyes:       Extraocular Movements: Extraocular movements intact.     Pupils: Pupils are equal, round, and reactive to light.  Cardiovascular:     Rate and Rhythm: Normal rate and regular rhythm.  Pulmonary:     Effort: Pulmonary effort is normal.     Breath sounds: Normal breath sounds.  Abdominal:     General: There is no distension.     Palpations: Abdomen is soft.     Tenderness: There is no abdominal tenderness.  Musculoskeletal:        General: No swelling. Normal range of motion.     Cervical back: Normal range of motion. No rigidity.  Skin:    General: Skin is warm and dry.  Neurological:     General: No focal deficit present.     Mental Status: She is alert and oriented to person, place, and time.    Psychiatric:        Mood and Affect: Mood normal.        Behavior: Behavior normal.        Thought Content: Thought content normal.        Judgment: Judgment normal.     Results: US  06/2019 CLINICAL DATA:  Right upper quadrant abdominal pain for 1 month  EXAM: ULTRASOUND ABDOMEN LIMITED RIGHT UPPER QUADRANT  COMPARISON:  None.  FINDINGS: Gallbladder:  Gallbladder lumen is entirely filled with echogenic, shadowing stones. No wall thickening visualized. No sonographic Murphy sign noted by sonographer.  Common bile duct:  Diameter: Mildly dilated measuring up to 9 mm  Liver:  Mild intrahepatic biliary dilatation. No focal lesion identified. Within normal limits in parenchymal echogenicity. Portal vein is patent on color Doppler imaging with normal direction of blood flow towards the liver.  Other: None.  IMPRESSION: 1. Gallbladder lumen entirely filled with echogenic, shadowing stones. No secondary sonographic findings of acute cholecystitis. 2. Mild intra and extrahepatic biliary dilatation without identifiable etiology by ultrasound. If further imaging evaluation is clinically warranted, MRCP could be performed.   Electronically Signed   By: Nicholas  Plundo D.O.   On: 06/23/2019 15:37  Assessment & Plan:  Stefanie Burgess is a 38 y.o. female with gallstones and dilated bile ducts on US. She has normal T bili otherwise. She has been having pain in the RUQ and nausea for some time now. She otherwise is healthy.   -PLAN: I counseled the patient about the indication, risks and benefits of laparoscopic cholecystectomy and intraoperative cholangiogram.  She understands there is a very small chance for bleeding, infection, injury to normal structures (including common bile duct), conversion to open surgery, persistent symptoms, evolution of postcholecystectomy diarrhea, allergic reactions to the dye, need for secondary interventions, anesthesia reaction,  cardiopulmonary issues and other risks not specifically detailed here. I described the expected recovery, the plan for follow-up and the restrictions during the recovery phase.  All questions were answered.  -Discussed need for COVID testing prior to the procedure.   All questions were answered to the satisfaction of the patient.  Stefanie Burgess 08/17/2019, 10:59 AM       

## 2019-08-17 NOTE — Progress Notes (Signed)
[facility] History and Physical  Reason for Referral: Gallstones  Referring Physician:  Roseanne Kaufman, NP   Chief Complaint    New Patient (Initial Visit)      Stefanie Burgess is a 39 y.o. female.  HPI: Stefanie Burgess is a 39 yo who is spanish speaking only. She comes in with pain in her epigastric and RUQ region with radiation into her back. She has had some minor associated nausea and vomited once. She says she has had pain like this on and off for several years but has been getting worse in the last few weeks. She underwent a EGD with Dr. Gala Romney with some gastritis but no H. Pylori or ulcer. She is on Pepcid.  She has regular Bms. She cares for her children.  She otherwise is relatively healthy. She has daily Bms and some complaints of rectal pain/ pressure. History taken with the help of an interpretor.   Past Medical History:  Diagnosis Date  . GERD (gastroesophageal reflux disease)     Past Surgical History:  Procedure Laterality Date  . BIOPSY  06/15/2019   Procedure: BIOPSY;  Surgeon: Daneil Dolin, MD;  Location: [facility];  Service: Endoscopy;;  . ESOPHAGOGASTRODUODENOSCOPY N/A 06/15/2019   normal esophagus, gastritis s/p biopsy, negative H.pylori, normal duodenum.   Marland Kitchen lipoma removal     as child from arm    Family History  Problem Relation Age of Onset  . Hyperlipidemia Mother   . Colitis Mother   . Hypertension Mother   . Colon cancer Neg Hx   . Colon polyps Neg Hx     Social History   Tobacco Use  . Smoking status: Never Smoker  . Smokeless tobacco: Never Used  Substance Use Topics  . Alcohol use: Never  . Drug use: Never    Medications: I have reviewed the patient's current medications. Allergies as of 08/17/2019      Reactions   Ranitidine Other (See Comments)   dizziness      Medication List       Accurate as of August 17, 2019 10:59 AM. If you have any questions, ask your nurse or doctor.        alum & mag hydroxide-simeth  200-200-20 MG/5ML suspension Commonly known as: MAALOX/MYLANTA Take 30 mLs by mouth every 6 (six) hours as needed for indigestion or heartburn.   calcium carbonate 750 MG chewable tablet Commonly known as: TUMS EX Chew 1-2 tablets by mouth 3 (three) times daily as needed (indigestion/heartburn).   famotidine 40 MG tablet Commonly known as: PEPCID Take 40 mg by mouth at bedtime.   Gas-X 80 MG chewable tablet Generic drug: simethicone Chew 80-160 mg by mouth every 6 (six) hours as needed for flatulence.   multivitamin capsule Take 1 capsule by mouth daily.   PROBIOTIC PO Take 1 capsule by mouth every other day.        ROS:  A comprehensive review of systems was negative except for: Gastrointestinal: positive for abdominal pain, nausea, reflux symptoms and vomiting Musculoskeletal: positive for back pain  Blood pressure 115/79, pulse 78, temperature 98.9 F (37.2 C), temperature source Oral, resp. rate 12, height 5\' 2"  (1.575 m), weight 125 lb (56.7 kg), last menstrual period 07/31/2019, SpO2 98 %. Physical Exam Vitals reviewed.  HENT:     Head: Normocephalic and atraumatic.     Nose: Nose normal.     Mouth/Throat:     Mouth: Mucous membranes are moist.  Eyes:  Extraocular Movements: Extraocular movements intact.     Pupils: Pupils are equal, round, and reactive to light.  Cardiovascular:     Rate and Rhythm: Normal rate and regular rhythm.  Pulmonary:     Effort: Pulmonary effort is normal.     Breath sounds: Normal breath sounds.  Abdominal:     General: There is no distension.     Palpations: Abdomen is soft.     Tenderness: There is no abdominal tenderness.  Musculoskeletal:        General: No swelling. Normal range of motion.     Cervical back: Normal range of motion. No rigidity.  Skin:    General: Skin is warm and dry.  Neurological:     General: No focal deficit present.     Mental Status: She is alert and oriented to person, place, and time.    Psychiatric:        Mood and Affect: Mood normal.        Behavior: Behavior normal.        Thought Content: Thought content normal.        Judgment: Judgment normal.     Results: [place]  06/2019 CLINICAL DATA:  Right upper quadrant abdominal pain for 1 month  EXAM: ULTRASOUND ABDOMEN LIMITED RIGHT UPPER QUADRANT  COMPARISON:  None.  FINDINGS: Gallbladder:  Gallbladder lumen is entirely filled with echogenic, shadowing stones. No wall thickening visualized. No sonographic Murphy sign noted by sonographer.  Common bile duct:  Diameter: Mildly dilated measuring up to 9 mm  Liver:  Mild intrahepatic biliary dilatation. No focal lesion identified. Within normal limits in parenchymal echogenicity. Portal vein is patent on color Doppler imaging with normal direction of blood flow towards the liver.  Other: None.  IMPRESSION: 1. Gallbladder lumen entirely filled with echogenic, shadowing stones. No secondary sonographic findings of acute cholecystitis. 2. Mild intra and extrahepatic biliary dilatation without identifiable etiology by ultrasound. If further imaging evaluation is clinically warranted, MRCP could be performed.   Electronically Signed   By: Duanne Guess D.O.   On: 06/23/2019 15:37  Assessment & Plan:  Stefanie Burgess is a 39 y.o. female with gallstones and dilated bile ducts on [place]. She has normal T bili otherwise. She has been having pain in the RUQ and nausea for some time now. She otherwise is healthy.   -PLAN: I counseled the patient about the indication, risks and benefits of laparoscopic cholecystectomy and intraoperative cholangiogram.  She understands there is a very small chance for bleeding, infection, injury to normal structures (including common bile duct), conversion to open surgery, persistent symptoms, evolution of postcholecystectomy diarrhea, allergic reactions to the dye, need for secondary interventions, anesthesia reaction,  cardiopulmonary issues and other risks not specifically detailed here. I described the expected recovery, the plan for follow-up and the restrictions during the recovery phase.  All questions were answered.  -Discussed need for COVID testing prior to the procedure.   All questions were answered to the satisfaction of the patient.  [name] 08/17/2019, 10:59 AM

## 2019-08-17 NOTE — Patient Instructions (Signed)
Colelitiasis Cholelithiasis  La colelitiasis es una enfermedad de la vescula biliar en la que se forman clculos biliares. La vescula biliar es un rgano que almacena bilis. La bilis se forma en el hgado y [place] a Engineer, agricultural. Los clculos comienzan como pequeos cristales y lentamente se transforman en piedras. Es posible que no presenten sntomas hasta que la vescula se endurece (contrae) y un clculo biliar bloquea un conducto (ataque de la vescula biliar) y esto puede Programmer, multimedia. La colelitiasis tambin es conocida como clculos en la vescula biliar. Hay dos tipos principales de clculos biliares:  Clculos de colesterol. Estos se forman por el colesterol endurecido y generalmente son de color amarillo verdoso. Son el tipo de clculos biliares ms frecuente. El colesterol es una sustancia blanca y cerosa parecida a la grasa que se forma en el hgado.  Clculos de pigmento. Estos son de color oscuro y estn formados por una sustancia amarillo rojiza que se forma cuando la hemoglobina de los glbulos rojos se descompone (bilirrubina). Cules son las causas? Este trastorno puede ser causado por un desequilibrio en las sustancias que componen la bilis. Esto puede suceder si la bilis:  Tiene mucha bilirrubina.  Tiene mucho colesterol.  No tiene suficiente sales biliares. Estas sales le ayudan al organismo a Environmental health practitioner y a Mining engineer. En ciertos casos, esta afeccin tambin puede ser causada por una vescula biliar que no se vaca completamente o lo suficientemente seguido. Qu incrementa el riesgo? Los siguientes factores pueden hacer que usted sea ms propenso a Aeronautical engineer afeccin:  Ser mujer.  Tener embarazos mltiples. Algunas veces los mdicos aconsejan extirpar los clculos biliares antes de futuros embarazos.  Tener una dieta con demasiadas comidas fritas, grasas y carbohidratos refinados, como el pan blanco y el arroz blanco.  Ser obeso.  Tener ms de 40  aos.  Uso prolongado de medicamentos que contienen hormonas femeninas (estrgenos).  Tener diabetes mellitus.  Prdida rpida de peso.  Antecedentes familiares de clculos biliares.  Ser descendiente de mexicanos o indios norteamericanos.  Tener una enfermedad intestinal como la enfermedad de Crohn.  Tener sndrome metablico.  Tener cirrosis.  Tener tipos graves de anemia, como la anemia drepanoctica. Cules son los signos o los sntomas? En la [employer] no hay sntomas. Estos se denominan clculos silenciosos. Si un clculo biliar bloquea las vas biliares, puede causar un ataque de la vescula biliar. El principal sntoma de un ataque de la vescula biliar es un dolor repentino en la parte superior derecha del abdomen. El dolor aparece generalmente a la noche o despus de comer comidas abundantes. El dolor puede durar una o varias horas y se puede extender hasta el hombro derecho o el pecho. Si el conducto biliar est bloqueado durante ms de algunas horas, puede causar infeccin o inflamacin de la vescula biliar, del hgado o del pncreas, lo que puede provocar:  Nuseas.  Vmitos.  Dolor abdominal durante 5 horas o ms.  Fiebre o escalofros.  Coloracin amarillenta de la piel y la partes blancas de los ojos (ictericia).  Orina de color oscuro.  Heces de color claro. Cmo se diagnostica? Esta afeccin se puede diagnosticar en funcin de lo siguiente:  Un examen fsico.  Sus antecedentes mdicos.  Una ecografa de la vescula biliar.  Exploracin por tomografa computarizada (TC).  Resonancia magntica (RM).  Un anlisis de sangre para detectar signos de infeccin o inflamacin.  Un estudio de la vescula biliar y las vas biliares (sistema biliar) que Ladonna Snide  material radiactivo no daino y Therapist, occupational que pueden Naval architect radiactivo (colescintigrafa). Este estudio examina cmo se contrae la vescula biliar y si las vas biliares  estn bloqueadas.  Insertar un pequeo tubo con una cmara en el extremo (endoscopio) a travs de la boca para inspeccionar las vas biliares y Engineer, manufacturing bloqueos (colangiopancreatografa retrgrada endoscpica). Cmo se trata? El tratamiento de los clculos biliares depende de la gravedad de la afeccin. Los clculos silenciosos no requieren TEFL teacher. Si los clculos causan un ataque de la vescula biliar u otros sntomas, puede ser Aeronautical engineer. Las opciones de tratamiento son:  Kandis Ban para extirpar la vescula biliar (colecistectoma). Este es el tratamiento ms frecuente.  Medicamentos para [employer]. Estos son ms efectivos para tratar clculos pequeos. Es posible que tenga que tomar medicamentos durante 6 a 12 meses.  Tratamiento con ondas de choque (litotricia biliar extracorporal). En este tratamiento, una mquina de ultrasonido enva ondas de choque a la vescula biliar para destruir los clculos en pequeos fragmentos. Estos fragmentos luego podrn pasar a los intestinos o ser disueltos con medicamentos. Esto no se hace con frecuencia.  Extraccin de los clculos mediante una colangiopancreatografa retrgrada endoscpica. Se utiliza una pequea cesta anexa al endoscopio para capturar y extraer los clculos. Siga estas indicaciones en su casa:  Tome los medicamentos de venta libre y los recetados solamente como se lo haya indicado el mdico.  Mantenga un peso saludable y consuma una dieta saludable. Que puede comprender lo siguiente: ? Reducir los [employer], como las comidas fritas. ? Reducir los carbohidratos refinados, como el pan blanco y el arroz blanco. ? Aumentar la cantidad de [place]. Alimentarse con alimentos como almendras, frutas y frijoles.  Concurra a todas las visitas de control como se lo haya indicado el mdico. Esto es importante. Comunquese con un mdico si:  Piensa que ha tenido un ataque de vescula biliar.  Le han  diagnosticado clculos silenciosos y tiene dolor abdominal o indigestin. Solicite ayuda de inmediato si:  Tiene dolor por un ataque de vescula biliar que dura ms de 2horas.  Tiene dolor abdominal que dura ms de 5horas.  Tiene fiebre o siente escalofros.  Tiene nuseas o vmitos persistentes.  Tiene ictericia.  Orina de color oscuro o tiene heces de color plido. Resumen  La colelitiasis (tambin llamada clculos en la vescula biliar) es una enfermedad en la que se forman clculos en la vescula.  A este trastorno lo causa un desequilibrio en las sustancias que componen la bilis. Esto puede suceder si la bilis tiene demasiado colesterol, demasiada bilirrubina o cantidad insuficiente de sales biliares.  Es ms probable que [employer] afeccin si es [place], est [place], [place] medicamentos con [place], es obeso, es mayor de 40 aos o tiene antecedentes familiares de clculos biliares. Tambin puede desarrollar clculos biliares si tiene diabetes, una enfermedad intestinal, cirrosis o sndrome metablico.  El tratamiento de los clculos biliares depende de la gravedad de la afeccin. Los clculos silenciosos no requieren TEFL teacher.  Si los clculos causan un ataque de la vescula biliar u otros sntomas, puede ser necesario un tratamiento. El tratamiento ms frecuente es la [place] para extirpar la vescula biliar. Esta informacin no tiene Theme park manager el consejo del mdico. Asegrese de hacerle al mdico cualquier pregunta que tenga. Document Revised: 01/17/2017 Document Reviewed: 10/14/2012 Elsevier Patient Education  2020 Elsevier Inc.   Colecistectoma laparoscpica Laparoscopic Cholecystectomy Una colecistectoma laparoscpica es un procedimiento que se realiza para extirpar la vescula biliar. La  vescula biliar es un rgano que tiene forma de pera y se encuentra debajo del hgado, del lado derecho del cuerpo. La vescula biliar almacena bilis, un lquido que  ayuda a [employer]. La colecistectoma se realiza con frecuencia debido a la inflamacin de la vescula biliar (colecistitis). Esta afeccin normalmente se debe a la acumulacin de clculos biliares (colelitiasis) en la vescula biliar. Estos clculos pueden obstruir el flujo de la bilis, lo que produce inflamacin y Social research officer, government. En los [employer], podr ser [place]. Este procedimiento se realiza a travs de incisiones pequeas en el abdomen (ciruga laparoscpica). Se introduce un endoscopio delgado que tiene Public relations account executive (laparoscopio) a travs de una incisin. A travs de las otras incisiones, se introducen pequeos instrumentos quirrgicos. En algunos casos, un procedimiento de [place] laparoscpica puede convertirse en un tipo de ciruga que se realiza a travs de una incisin ms grande [place] Informe al mdico acerca de lo siguiente:  Cualquier alergia que tenga.  Todos los [employer], incluidos vitaminas, hierbas, gotas oftlmicas, cremas y medicamentos de venta libre.  Cualquier problema que usted o sus familiares hayan tenido con anestsicos.  Cualquier enfermedad de la sangre que tenga.  Cirugas a las que se someti.  Cualquier afeccin mdica que tenga.  Si est embarazada o podra estarlo. Cules son los riesgos? En general, se trata de un procedimiento seguro. Sin embargo, pueden ocurrir complicaciones, por ejemplo:  Infeccin.  Hemorragia.  Reacciones alrgicas a los medicamentos.  Daos a Catering manager u otros rganos.  Un clculo que queda en el conducto biliar comn (coldoco). El conducto coldoco transporta la bilis desde la vescula biliar hacia el intestino delgado.  Una filtracin de bilis del conducto cstico que se comprime cuando se extirpa la vescula biliar. Medicamentos  Consulte al mdico sobre: ? Quarry manager o suspender los medicamentos que toma habitualmente. Esto es muy importante si toma  medicamentos para la diabetes o anticoagulantes. ? Tomar medicamentos como aspirina e ibuprofeno. Estos medicamentos pueden tener un efecto anticoagulante en la [place]. No tome estos medicamentos antes del procedimiento si su mdico le indica que no lo haga.  Pueden indicarle un antibitico para ayudar a prevenir infecciones. Instrucciones generales  Infrmele al mdico antes de la ciruga si se ha resfriado o si tiene una infeccin.  Haga que alguien lo lleve a su casa desde el hospital o la clnica.  Pregntele al mdico cmo se Scientist, clinical (histocompatibility and immunogenetics) o se Museum/gallery curator de la Leisure centre manager. Qu ocurre durante el procedimiento?   Para disminuir el riesgo de contraer una infeccin: ? El equipo mdico se lavar o se Transport planner. ? Le lavarn la piel con jabn. ? Pueden rasurarle la zona [place].  Pueden colocarle un tubo (catter) intravenoso en una de las venas.  Le administrarn uno o ms de los siguientes medicamentos: ? Un medicamento para ayudarlo a relajarse (sedante). ? Un medicamento que lo har dormir (anestesia general).  Le colocarn un tubo en la boca para que pueda respirar.  Su cirujano le har varios cortes pequeos (incisiones) en el abdomen.  El laparoscopio se introducir a travs de una de las pequeas incisiones. La cmara del laparoscopio enviar imgenes a una pantalla de televisin (monitor) que se encuentra en el quirfano. Esto permitir a su Adult nurse del abdomen.  Le inyectarn un gas similar al aire en el abdomen. Esto expandir el abdomen para que el cirujano tenga ms lugar para Chief of Staff.  El resto del instrumental  necesario para el procedimiento se introducir a travs de las otras incisiones. Se extirpar la vescula biliar a travs de una de las incisiones.  Se puede examinar el conducto coldoco. Si se encuentran clculos en el conducto coldoco, tal vez deban extirparse.  Despus de la extirpacin de la vescula biliar, se  cerrarn las incisiones con puntos (suturas), grapas o goma para cerrar la piel.  Las incisiones pueden cubrirse con una venda (vendaje). Este procedimiento puede variar segn el mdico y el hospital. Ladell Heads ocurre despus del procedimiento?  Le controlarn la presin arterial, la frecuencia cardaca, la frecuencia respiratoria y Air cabin crew de oxgeno en la sangre hasta que desaparezca el efecto de los medicamentos administrados.  Le darn analgsicos para Human resources officer, si es necesario.  No conduzca durante 24horas si le administraron un sedante. Esta informacin no tiene Theme park manager el consejo del mdico. Asegrese de hacerle al mdico cualquier pregunta que tenga. Document Revised: 08/02/2017 Document Reviewed: 10/09/2015 Elsevier Patient Education  2020 [employer].

## 2019-08-20 ENCOUNTER — Telehealth: Payer: Self-pay | Admitting: Emergency Medicine

## 2019-08-20 NOTE — Telephone Encounter (Signed)
Pt called verified name and dob pt  and stated we referred her to [facility] and that she is having a "gallbladder attack " pt stated she did have an appt with  doctor [facility] three days ago and she is still having problems. Notified pt to please f/u with their office for future recommendations

## 2019-08-23 ENCOUNTER — Telehealth: Payer: Self-pay

## 2019-08-23 NOTE — Telephone Encounter (Signed)
Spoke with patient sons- 4/15 had a lot of pain and heartburn. Low fat diet, bland diet discussed.  Patient is asking if surgery can be moved up. I let her know once I speak with DR.Henreitta Leber I will call her back.

## 2019-09-03 NOTE — Patient Instructions (Addendum)
Instrucciones Para Antes de la Ciruga   Su ciruga est programada para-  09/08/2019 at 6:15 AM   [facility] -     Por favor llame al [phone] si tiene algn problema en la maana de la ciruga.              No coma alimentos ni tome lquidos, incluyendo agua, despus de la medianoche del     [employer] estas medicinas en la maana de la ciruga con un SORBITO de agua  None   Puede cepillarse los dientes en la maana de la [place]. (you may brush your teeth the morning of surgery)   No use joyas, maquillaje de ojos, lpiz labial, crema para el cuerpo o esmalte de uas oscuro. (Do not wear jewelry, eye makeup, lipstick, body lotion, or dark fingernail polish)   No puede usar desodorante. (you may wear deodorant)   Si va a ser ingresado despues de la ciruga, deje la maleta en el carro hasta que se le haya asignado una habitacin. (If you are to be admitted after surgery, leave suitcase in car until your room has been assigned.)   A los pacientes que se les d de alta el mismo da no se les permitir manejar a casa.  (Patients discharged on the day of surgery will not be allowed to drive home)   Use ropa suelta y cmoda de regreso a casa. (wear loose comfortable clothes for ride home)     Colecistectoma laparoscpica, cuidados posteriores Laparoscopic Cholecystectomy, Care After Lea esta informacin sobre cmo cuidarse despus del procedimiento. El mdico tambin podr darle indicaciones ms especficas. Si tiene problemas o preguntas, llame al mdico. Siga estas indicaciones en su casa: Cuidado de los cortes de la ciruga (incisiones)   Siga las indicaciones del mdico en lo que respecta al cuidado de los cortes de la [place]. Haga lo siguiente: ? Lvese las manos con agua y jabn antes de Multimedia programmer las vendas (vendaje). Use un desinfectante para manos si no dispone de [place] y [place]. ? Cambie el vendaje  como se lo haya indicado el mdico. ? No retire los puntos (suturas), la goma para cerrar la piel o las tiras [place]. Tal vez deban dejarse puestos en la piel durante 2semanas o ms tiempo. Si las tiras [place] se despegan y se enroscan, puede recortar los bordes sueltos. No retire las tiras [employer] por completo a menos que el mdico lo autorice.  No tome baos de inmersin, no practique natacin ni use el jacuzzi hasta que el mdico lo autorice. Pregntele al mdico si puede ducharse. Delle Reining solo le permitan tomar baos de [place].  Controle la zona alrededor del corte todos los das para detectar signos de infeccin. Est atento a los siguientes signos: ? Aumento del enrojecimiento, de la hinchazn o del dolor. ? Ms lquido [place]. ? Calor. ? Pus o mal olor. Actividad  No conduzca ni use maquinaria pesada mientras toma analgsicos recetados.  No levante ningn objeto que pese ms de 10libras (4,5kg) hasta que el mdico lo autorice.  No practique deportes de contacto hasta que el mdico lo autorice.  No conduzca durante 24horas si  le dieron un medicamento para ayudarlo a que se relaje (sedante).  Descanse todo lo que sea necesario. No retome el trabajo ni el estudio hasta que el mdico lo autorice. Instrucciones generales  [employer] de venta libre y los recetados solamente como se lo haya indicado el mdico.  A fin de prevenir o tratar el estreimiento mientras toma analgsicos recetados, el mdico puede recomendarle lo siguiente: ? Product manager suficiente lquido para Pharmacologist el pis (orina) claro o de color amarillo plido. ? Tomar medicamentos recetados o de [employer]. ? Consumir alimentos ricos en fibra, como frutas y verduras frescas, cereales integrales y frijoles. ? Limitar el consumo de alimentos con alto contenido de grasas y azcares procesados, como alimentos fritos o dulces. Comunquese con un mdico si:  Le aparece una erupcin cutnea.  Tiene ms  enrojecimiento, hinchazn o dolor alrededor [employer] cortes de la [place].  Aumenta la cantidad de lquido o sangre que sale de los cortes.  Los cortes de la ciruga se sienten calientes al tacto.  Tiene pus o percibe mal olor que [employer] cortes.  Tiene fiebre.  Se abren uno o ms de los cortes. Solicite ayuda de inmediato si:  Tiene dificultad para respirar.  Siente dolor en el pecho.  Siente dolor que empeora en la zona de los hombros.  Se desmaya o se siente mareado al ponerse de pie.  Tiene dolor muy intenso de vientre (abdomen).  Tiene malestar estomacal (nuseas) que dura ms de Civil engineer, contracting.  Vomita durante ms de Civil engineer, contracting.  Siente dolor en la pierna. Esta informacin no tiene Theme park manager el consejo del mdico. Asegrese de hacerle al mdico cualquier pregunta que tenga. Document Revised: 07/29/2016 Document Reviewed: 10/09/2015 Elsevier Patient Education  2020 Elsevier Inc.  Anestesia general en adultos, cuidados posteriores General Anesthesia, Adult, Care After Lea esta informacin sobre cmo cuidarse despus del procedimiento. El mdico tambin podr darle instrucciones ms especficas. Comunquese con su mdico si tiene problemas o preguntas. Qu puedo esperar despus del procedimiento? Luego del procedimiento, son comunes los siguiente efectos secundarios:  Dolor o Associate Professor en el lugar de la va intravenosa (i.v.).  Nuseas.  Vmitos.  Dolor de Advertising copywriter.  Dificultad para concentrarse.  Sentir fro o [employer].  Debilidad o cansancio.  Somnolencia y Management consultant.  Malestar y Tourist information centre manager. Estos efectos secundarios pueden afectar partes del cuerpo que no estuvieron involucradas en la ciruga. Siga estas indicaciones en su casa:  Durante al menos 24horas despus del procedimiento:  Pdale a un adulto responsable que permanezca con usted. Es importante que alguien cuide de usted hasta que se despierte y Statistician.  Descanse todo lo que  sea necesario.  No haga lo siguiente: ? Participar en actividades en las que podra caerse o lastimarse. ? Conducir. ? Operar maquinarias pesadas. ? Beber alcohol. ? Tomar somnferos o medicamentos que causen somnolencia. ? Firmar documentos legales ni tomar [employer]. ? Cuidar a nios por su cuenta. Qu debe comer y beber  Siga las indicaciones del mdico respecto de las restricciones de comidas o bebidas.  Cuando [employer], comience a comer cantidades pequeas de alimentos que sean blandos y fciles de Location manager (livianos), como una tostada. Retome su dieta habitual de forma gradual.  Beba suficiente lquido como para mantener la orina de color amarillo plido.  Si vomita, rehidrtese tomando agua, jugo o caldo transparente. Instrucciones generales  Si tiene apnea del sueo, la [place] y ciertos medicamentos pueden aumentar el riesgo de problemas respiratorios.  Siga las indicaciones del mdico respecto al uso de su dispositivo para dormir: ? Siempre que duerma, incluso durante las siestas que tome en el da. ? Mientras tome analgsicos recetados, medicamentos para dormir o medicamentos que producen somnolencia.  Reanude sus actividades normales segn lo indicado por el mdico. Pregntele al mdico qu actividades son seguras para usted.  Tome los medicamentos de venta libre y los recetados solamente como se lo haya indicado el mdico.  Si fuma, no lo haga sin supervisin.  Concurra a todas las visitas de [address] se lo haya indicado el mdico. Esto es importante. Comunquese con un mdico si:  Tiene nuseas o vmitos que no mejoran con medicamentos.  No puede comer ni beber sin vomitar.  El dolor no se alivia con medicamentos.  No puede orinar.  Tiene una erupcin cutnea.  Tiene fiebre.  Presenta enrojecimiento alrededor del lugar de la va intravenosa (i.v.) que empeora. Solicite ayuda de inmediato si:  Tiene dificultad para  respirar.  Siente dolor en el pecho.  Observa sangre en la orina o heces, o vomita sangre. Resumen  Despus del procedimiento, es comn tener dolor de garganta y nuseas. Tambin es comn sentirse cansado.  Pdale a un adulto responsable que permanezca con usted durante 24 horas despus de la anestesia general. Es importante que alguien cuide de usted hasta que se despierte y Statistician.  Cuando [employer], comience a comer cantidades pequeas de alimentos que sean blandos y fciles de Location manager (livianos), como una tostada. Retome su dieta habitual de forma gradual.  Beba suficiente lquido como para mantener la orina de color amarillo plido.  Reanude sus actividades normales segn lo indicado por el mdico. Pregntele al mdico qu actividades son seguras para usted. Esta informacin no tiene Theme park manager el consejo del mdico. Asegrese de hacerle al mdico cualquier pregunta que tenga. Document Revised: 02/17/2017 Document Reviewed: 02/17/2017 Elsevier Patient Education  2020 Elsevier Inc.  Cmo usar clorhexidina para baarse How to Use Chlorhexidine for Bathing El gluconato de clorhexidina (CHG) es una solucin desinfectante (antisptica) que se utiliza para limpiar la piel. Puede eliminar las bacterias que normalmente viven en la piel y mantenerlas alejadas durante aproximadamente 24 horas. Para limpiarse la piel con CHG, es posible que le den lo siguiente:  Una solucin de CHG para usar en la ducha o como parte de un bao de [place].  Un pao preenvasado que contenga CHG. Limpiar la piel con CHG puede ayudar a disminuir el riesgo de infeccin:  Mientras permanece en la unidad de cuidados intensivos del hospital.  Si tiene un acceso vascular, como una va central, para proporcionar acceso a corto o largo plazo a las venas.  Si tiene un catter para que drene orina de la vejiga.  Si tiene Advertising account planner. El respirador es un aparato que lo ayuda a Industrial/product designer al hacer que  el aire entre y salga de sus pulmones.  Despus de [place]. Cules son los riesgos? Los riesgos de usar de CHG incluyen los siguientes:  Reacciones en la piel.  Prdida de la audicin si el CHG ingresa en los odos.  Lesin ocular si el CHG ingresa en los ojos y no se enjuaga.  El CHG es inflamable. Asegrese de no fumar ni acercarse al fuego luego de aplicarse CHG en la piel. No use CHG:  Si es alrgico a la clorhexidina o anteriormente tuvo una reaccin a la clorhexidina.  En bebs de menos de 2 meses. Cmo usar la solucin de  CHG  Use CHG nicamente como se lo indique su mdico y [address] A[address]uesiga las instrucciones de la [place].  Use la cantidad de CHG que le indicaron. Usualmente, la medida es una botella. Durante una ducha Siga estos pasos al usar la solucin de CHG durante una ducha (a menos que su mdico le brinde instrucciones diferentes): 1. Comience a ducharse. 2. Lvese la cara y el cabello con el jabn y el champ que utiliza habitualmente. 3. Cierre la ducha o salga de abajo del agua. 4. Vierta el CHG en un pao limpio. No use ningn cepillo o esponja con bordes rugosos. 5. Comience en el cuello y colquese la espuma en todo el cuerpo Tribune Companyhasta los pies. Asegrese de seguir estas instrucciones: ? Si le harn una ciruga, preste especial atencin a la parte del cuerpo Dance movement psychotherapistdonde le realizarn la ciruga. Frote la zona durante al menos 1 minuto. ? No use el CHG en su cabeza o cara. Si la solucin Lockheed Martinentra en los odos o los ojos, enjuague bien con [place]. ? Evite la zona genital. ? Evite las zonas de la piel que estn lastimadas o tengan cortes o raspaduras. ? Frote su espalda y General Dynamicsdebajo de los brazos. Asegrese de lavar los pliegues de la piel. 6. Deje actuar la espuma por 1 o 2 minutos, o por el tiempo que le haya indicado el mdico. 7. Enjuguese todo el cuerpo bajo la ducha. Asegrese de enjuagar bien todos los pliegues y hendiduras de la piel. 8. Seque con una toalla limpia. Despus  no aplique ninguna sustancia en el cuerpo, como polvo, locin o perfume, excepto que se lo indique el mdico. Solo use las lociones recomendadas por el fabricante. 9. Pngase pijama o ropa limpia. 10. Si es la noche anterior a la ciruga, duerma en sbanas limpias.  Durante un bao de esponja Siga estos pasos al usar la solucin de CHG durante un bao de [place] (a menos que su mdico le brinde instrucciones diferentes): 1. Lvese la cara y el cabello con el jabn y el champ que utiliza habitualmente. 2. Vierta el CHG en un pao limpio. 3. Comience en el cuello y colquese la espuma en todo el cuerpo Tribune Companyhasta los pies. Asegrese de seguir estas instrucciones: ? Si le harn una ciruga, preste especial atencin a la parte del cuerpo Dance movement psychotherapistdonde le realizarn la ciruga. Frote la zona durante al menos 1 minuto. ? No use el CHG en su cabeza o cara. Si la solucin Lockheed Martinentra en los odos o los ojos, enjuague bien con [place]. ? Evite la zona genital. ? Evite las zonas de la piel que estn lastimadas o tengan cortes o raspaduras. ? Frote su espalda y General Dynamicsdebajo de los brazos. Asegrese de lavar los pliegues de la piel. 4. Deje actuar la espuma por 1 o 2 minutos, o por el tiempo que le haya indicado el mdico. 5. Con un pao limpio y hmedo diferente, enjuguese bien todo el cuerpo. Asegrese de enjuagar bien todos los pliegues y hendiduras de la piel. 6. Seque con una toalla limpia. Despus no aplique ninguna sustancia en el cuerpo, como polvo, locin o perfume, excepto que se lo indique el mdico. Solo use las lociones recomendadas por el fabricante. 7. Pngase pijama o ropa limpia. 8. Si es la noche anterior a la ciruga, duerma en sbanas limpias. Qwest CommunicationsComo usar los paos preenvasados de CHG  Use los paos de CHG nicamente como se lo haya indicado el mdico y siga las instrucciones de la etiqueta.  Use los paos de  CHG sobre la piel limpia y [place].  No use el pao de CHG en la cabeza o el rostro a menos que el mdico  se lo indique.  Cuando lave con el pao de CHG: ? Evite la zona genital. ? Evite las zonas de la piel que estn lastimadas o tengan cortes o raspaduras. Antes de la ciruga Siga estos pasos al usar un pao de CHG para limpiar antes de una ciruga (a menos que su mdico le brinde instrucciones diferentes): 1. Con el pao de CHG, frtese con firmeza la parte del cuerpo donde le realizarn la ciruga. Frtese de un lado al otro durante 3 minutos. El rea del cuerpo debe estar completamente hmeda con CHG cuando termine de frotar. 2. No enjuague. Deseche el pao y deje que el rea se seque al aire. Despus no aplique ninguna sustancia sobre el rea, como polvos, lociones o perfume. 3. Pngase pijama o ropa limpia. 4. Si es la noche anterior a la ciruga, duerma en sbanas limpias.  Para baarse en general Siga estos pasos al usar el pao de CHG para baarse en general (a menos que su mdico le brinde instrucciones diferentes). 1. Use un pao de CHG diferente en cada rea del cuerpo. Asegrese de [employer] de la piel y [place] dedos de las manos y de los pies. Lvese el cuerpo en el siguiente orden, usando un pao nuevo despus de cada paso: ? La parte delantera del cuello, los hombros y [place]. ? Ambos brazos, debajo de los brazos y [place]. ? El estmago y la zona de la ingle, evitando los genitales. ? La pierna y el pie derechos. ? La pierna y el pie izquierdos. ? La parte posterior del cuello, la espalda y las nalgas. 2. No enjuague. Deseche el pao y deje que el rea se seque al aire. Despus no aplique ninguna sustancia en el cuerpo, como polvo, locin o perfume, excepto que se lo indique el mdico. Solo use las lociones recomendadas por el fabricante. 3. Pngase pijama o ropa limpia. Comunquese con un mdico si:  La piel se irrita despus de frotarla.  Tiene preguntas sobre cmo usar la solucin o el pao. Solicite ayuda inmediatamente si:  Los ojos se ponen  muy rojos o Cytogeneticist.  Siente picazn intensa en los ojos.  Siente picazn intensa en la piel y est roja o hinchada.  Nota cambios en su audicin.  Tiene dificultad para ver.  Tiene hinchazn u hormigueo en la garganta o la boca.  Tiene dificultad para respirar.  Traga un poco de clorhexidina. Resumen  El gluconato de clorhexidina (CHG) es una solucin desinfectante (antisptica) que se utiliza para limpiar la piel. Limpiar la piel con CHG puede ayudar a disminuir el riesgo de infeccin.  Es posible que le indiquen que utilice CHG para baarse. Esta solucin puede venir en botella o en un pao preenvasado para que use sobre su piel. Siga cuidadosamente las instrucciones del mdico y las instrucciones que se encuentran en la etiqueta del producto.  No use CHG si es alrgico a la clorhexidina.  Pngase en contacto con su mdico si se le irrita la piel luego de frotar. Esta informacin no tiene Marine scientist el consejo del mdico. Asegrese de hacerle al mdico cualquier pregunta que tenga. Document Revised: 08/11/2018 Document Reviewed: 05/24/2017 Elsevier Patient Education  [place].

## 2019-09-06 ENCOUNTER — Encounter (HOSPITAL_COMMUNITY)
Admission: RE | Admit: 2019-09-06 | Discharge: 2019-09-06 | Disposition: A | Discharge status: Home / Self Care | Payer: Self-pay | Source: Ambulatory Visit | Attending: General Surgery | Admitting: General Surgery

## 2019-09-06 ENCOUNTER — Other Ambulatory Visit (HOSPITAL_COMMUNITY)
Admission: RE | Admit: 2019-09-06 | Discharge: 2019-09-06 | Disposition: A | Discharge status: Home / Self Care | Payer: Medicaid Other | Source: Ambulatory Visit | Attending: General Surgery | Admitting: General Surgery

## 2019-09-06 ENCOUNTER — Other Ambulatory Visit: Payer: Self-pay

## 2019-09-06 DIAGNOSIS — Z20822 Contact with and (suspected) exposure to covid-19: Secondary | ICD-10-CM | POA: Insufficient documentation

## 2019-09-06 DIAGNOSIS — Z01812 Encounter for preprocedural laboratory examination: Secondary | ICD-10-CM | POA: Diagnosis not present

## 2019-09-06 LAB — SARS CORONAVIRUS 2 (TAT 6-24 HRS): SARS Coronavirus 2: NEGATIVE

## 2019-09-06 LAB — PREGNANCY, URINE: Preg Test, Ur: NEGATIVE

## 2019-09-08 ENCOUNTER — Encounter (HOSPITAL_COMMUNITY): Payer: Self-pay | Admitting: General Surgery

## 2019-09-08 ENCOUNTER — Ambulatory Visit (HOSPITAL_COMMUNITY): Payer: Self-pay | Admitting: Anesthesiology

## 2019-09-08 ENCOUNTER — Ambulatory Visit (HOSPITAL_COMMUNITY): Payer: Self-pay

## 2019-09-08 ENCOUNTER — Ambulatory Visit (HOSPITAL_COMMUNITY)
Admission: RE | Admit: 2019-09-08 | Discharge: 2019-09-08 | Disposition: A | Discharge status: Home / Self Care | Payer: Self-pay | Attending: General Surgery | Admitting: General Surgery

## 2019-09-08 ENCOUNTER — Encounter (HOSPITAL_COMMUNITY)
Admission: RE | Disposition: A | Discharge status: Home / Self Care | Payer: Self-pay | Source: Home / Self Care | Attending: General Surgery

## 2019-09-08 DIAGNOSIS — K219 Gastro-esophageal reflux disease without esophagitis: Secondary | ICD-10-CM | POA: Insufficient documentation

## 2019-09-08 DIAGNOSIS — K8020 Calculus of gallbladder without cholecystitis without obstruction: Secondary | ICD-10-CM

## 2019-09-08 DIAGNOSIS — Z888 Allergy status to other drugs, medicaments and biological substances status: Secondary | ICD-10-CM | POA: Insufficient documentation

## 2019-09-08 DIAGNOSIS — K819 Cholecystitis, unspecified: Secondary | ICD-10-CM

## 2019-09-08 DIAGNOSIS — K8010 Calculus of gallbladder with chronic cholecystitis without obstruction: Secondary | ICD-10-CM | POA: Insufficient documentation

## 2019-09-08 DIAGNOSIS — Z8249 Family history of ischemic heart disease and other diseases of the circulatory system: Secondary | ICD-10-CM | POA: Insufficient documentation

## 2019-09-08 DIAGNOSIS — K6289 Other specified diseases of anus and rectum: Secondary | ICD-10-CM | POA: Insufficient documentation

## 2019-09-08 DIAGNOSIS — Z8379 Family history of other diseases of the digestive system: Secondary | ICD-10-CM | POA: Insufficient documentation

## 2019-09-08 DIAGNOSIS — K838 Other specified diseases of biliary tract: Secondary | ICD-10-CM

## 2019-09-08 HISTORY — PX: CHOLECYSTECTOMY: SHX55

## 2019-09-08 SURGERY — LAPAROSCOPIC CHOLECYSTECTOMY WITH INTRAOPERATIVE CHOLANGIOGRAM
Anesthesia: General

## 2019-09-08 MED ORDER — BUPIVACAINE HCL (PF) 0.5 % IJ SOLN
INTRAMUSCULAR | Status: DC | PRN
  Administered 2019-09-08: 10 mL

## 2019-09-08 MED ORDER — OXYCODONE HCL 5 MG PO TABS: 5.0000 mg | ORAL_TABLET | ORAL | 0 refills | Status: DC | PRN

## 2019-09-08 MED ORDER — LIDOCAINE HCL (CARDIAC) PF 50 MG/5ML IV SOSY
PREFILLED_SYRINGE | INTRAVENOUS | Status: DC | PRN
  Administered 2019-09-08: 40 mg via INTRAVENOUS

## 2019-09-08 MED ORDER — ONDANSETRON HCL 4 MG/2ML IJ SOLN
INTRAMUSCULAR | Status: DC | PRN
  Administered 2019-09-08: 4 mg via INTRAVENOUS

## 2019-09-08 MED ORDER — PROMETHAZINE HCL 25 MG/ML IJ SOLN
INTRAMUSCULAR | Status: AC
  Filled 2019-09-08: qty 1

## 2019-09-08 MED ORDER — PROPOFOL 10 MG/ML IV BOLUS
INTRAVENOUS | Status: DC | PRN
  Administered 2019-09-08: 120 mg via INTRAVENOUS

## 2019-09-08 MED ORDER — DEXAMETHASONE SODIUM PHOSPHATE 4 MG/ML IJ SOLN
INTRAMUSCULAR | Status: DC | PRN
  Administered 2019-09-08: 8 mg via INTRAVENOUS

## 2019-09-08 MED ORDER — SODIUM CHLORIDE 0.9 % IV SOLN
INTRAVENOUS | Status: AC
  Filled 2019-09-08: qty 2

## 2019-09-08 MED ORDER — FENTANYL CITRATE (PF) 100 MCG/2ML IJ SOLN
INTRAMUSCULAR | Status: DC | PRN
  Administered 2019-09-08 (×5): 50 ug via INTRAVENOUS

## 2019-09-08 MED ORDER — 0.9 % SODIUM CHLORIDE (POUR BTL) OPTIME
TOPICAL | Status: DC | PRN
  Administered 2019-09-08: 1000 mL

## 2019-09-08 MED ORDER — SODIUM CHLORIDE 0.9 % IV SOLN
2.0000 g | INTRAVENOUS | Status: AC
  Administered 2019-09-08: 2 g via INTRAVENOUS

## 2019-09-08 MED ORDER — MIDAZOLAM HCL 5 MG/5ML IJ SOLN
INTRAMUSCULAR | Status: DC | PRN
  Administered 2019-09-08: 2 mg via INTRAVENOUS

## 2019-09-08 MED ORDER — ONDANSETRON HCL 4 MG/2ML IJ SOLN
INTRAMUSCULAR | Status: AC
  Filled 2019-09-08: qty 2

## 2019-09-08 MED ORDER — ONDANSETRON HCL 4 MG PO TABS: 4.0000 mg | ORAL_TABLET | Freq: Every day | ORAL | 1 refills | Status: AC | PRN

## 2019-09-08 MED ORDER — FENTANYL CITRATE (PF) 250 MCG/5ML IJ SOLN
INTRAMUSCULAR | Status: AC
  Filled 2019-09-08: qty 5

## 2019-09-08 MED ORDER — MIDAZOLAM HCL 2 MG/2ML IJ SOLN
INTRAMUSCULAR | Status: AC
  Filled 2019-09-08: qty 2

## 2019-09-08 MED ORDER — OXYCODONE HCL 5 MG PO TABS
ORAL_TABLET | ORAL | Status: AC
  Filled 2019-09-08: qty 1

## 2019-09-08 MED ORDER — FENTANYL CITRATE (PF) 100 MCG/2ML IJ SOLN
25.0000 ug | INTRAMUSCULAR | Status: DC | PRN
  Administered 2019-09-08 (×2): 50 ug via INTRAVENOUS
  Filled 2019-09-08: qty 2

## 2019-09-08 MED ORDER — CHLORHEXIDINE GLUCONATE CLOTH 2 % EX PADS: 6.0000 | MEDICATED_PAD | Freq: Once | CUTANEOUS | Status: DC

## 2019-09-08 MED ORDER — BUPIVACAINE HCL (PF) 0.5 % IJ SOLN
INTRAMUSCULAR | Status: AC
  Filled 2019-09-08: qty 30

## 2019-09-08 MED ORDER — SODIUM CHLORIDE (PF) 0.9 % IJ SOLN
INTRAMUSCULAR | Status: DC | PRN
  Administered 2019-09-08: 30 mL

## 2019-09-08 MED ORDER — DEXAMETHASONE SODIUM PHOSPHATE 10 MG/ML IJ SOLN
INTRAMUSCULAR | Status: AC
  Filled 2019-09-08: qty 1

## 2019-09-08 MED ORDER — HEMOSTATIC AGENTS (NO CHARGE) OPTIME
TOPICAL | Status: DC | PRN
  Administered 2019-09-08: 1 via TOPICAL

## 2019-09-08 MED ORDER — PROPOFOL 10 MG/ML IV BOLUS
INTRAVENOUS | Status: AC
  Filled 2019-09-08: qty 40

## 2019-09-08 MED ORDER — LACTATED RINGERS IV SOLN
INTRAVENOUS | Status: DC
  Administered 2019-09-08: 1000 mL via INTRAVENOUS

## 2019-09-08 MED ORDER — DOCUSATE SODIUM 100 MG PO CAPS
100.0000 mg | ORAL_CAPSULE | Freq: Two times a day (BID) | ORAL | 2 refills | Status: AC
Start: 2019-09-08 — End: 2020-09-07

## 2019-09-08 MED ORDER — PROMETHAZINE HCL 25 MG/ML IJ SOLN
12.5000 mg | Freq: Four times a day (QID) | INTRAMUSCULAR | Status: DC | PRN
  Administered 2019-09-08: 12.5 mg via INTRAVENOUS

## 2019-09-08 MED ORDER — ROCURONIUM 10MG/ML (10ML) SYRINGE FOR MEDFUSION PUMP - OPTIME
INTRAVENOUS | Status: DC | PRN
  Administered 2019-09-08: 40 mg via INTRAVENOUS

## 2019-09-08 MED ORDER — OXYCODONE HCL 5 MG PO TABS
5.0000 mg | ORAL_TABLET | Freq: Once | ORAL | Status: AC
  Administered 2019-09-08: 5 mg via ORAL

## 2019-09-08 MED ORDER — SUGAMMADEX SODIUM 200 MG/2ML IV SOLN
INTRAVENOUS | Status: DC | PRN
  Administered 2019-09-08: 100 mg via INTRAVENOUS

## 2019-09-08 MED ORDER — ROCURONIUM BROMIDE 10 MG/ML (PF) SYRINGE
PREFILLED_SYRINGE | INTRAVENOUS | Status: AC
  Filled 2019-09-08: qty 20

## 2019-09-08 SURGICAL SUPPLY — 49 items
APPLIER CLIP ROT 10 11.4 M/L (STAPLE) ×3
BAG RETRIEVAL 10MM (BASKET) ×1
BLADE SURG 15 STRL LF DISP TIS (BLADE) ×1 IMPLANT
BLADE SURG 15 STRL SS (BLADE) ×2
CATH CHOLANGIOGRAM 4.5FR (CATHETERS) ×2 IMPLANT
CHLORAPREP W/TINT 26 (MISCELLANEOUS) ×3 IMPLANT
CLIP APPLIE ROT 10 11.4 M/L (STAPLE) ×1 IMPLANT
CLOTH BEACON ORANGE TIMEOUT ST (SAFETY) ×3 IMPLANT
COVER LIGHT HANDLE STERIS (MISCELLANEOUS) ×6 IMPLANT
COVER WAND RF STERILE (DRAPES) ×3 IMPLANT
DECANTER SPIKE VIAL GLASS SM (MISCELLANEOUS) ×6 IMPLANT
DERMABOND ADVANCED (GAUZE/BANDAGES/DRESSINGS) ×2
DERMABOND ADVANCED .7 DNX12 (GAUZE/BANDAGES/DRESSINGS) ×1 IMPLANT
DRAPE C-ARM FOLDED MOBILE STRL (DRAPES) ×3 IMPLANT
ELECT REM PT RETURN 9FT ADLT (ELECTROSURGICAL) ×3
ELECTRODE REM PT RTRN 9FT ADLT (ELECTROSURGICAL) ×1 IMPLANT
GLOVE BIO SURGEON STRL SZ 6.5 (GLOVE) ×2 IMPLANT
GLOVE BIO SURGEONS STRL SZ 6.5 (GLOVE) ×1
GLOVE BIOGEL PI IND STRL 6.5 (GLOVE) ×1 IMPLANT
GLOVE BIOGEL PI IND STRL 7.0 (GLOVE) ×3 IMPLANT
GLOVE BIOGEL PI INDICATOR 6.5 (GLOVE) ×2
GLOVE BIOGEL PI INDICATOR 7.0 (GLOVE) ×6
GLOVE SURG SS PI 7.5 STRL IVOR (GLOVE) ×6 IMPLANT
GOWN STRL REUS W/ TWL LRG LVL3 (GOWN DISPOSABLE) IMPLANT
GOWN STRL REUS W/TWL LRG LVL3 (GOWN DISPOSABLE) ×9 IMPLANT
HEMOSTAT SNOW SURGICEL 2X4 (HEMOSTASIS) ×3 IMPLANT
INST SET LAPROSCOPIC AP (KITS) ×3 IMPLANT
KIT TURNOVER KIT A (KITS) ×3 IMPLANT
MANIFOLD NEPTUNE II (INSTRUMENTS) ×3 IMPLANT
NEEDLE INSUFFLATION 120MM (ENDOMECHANICALS) ×3 IMPLANT
NS IRRIG 1000ML POUR BTL (IV SOLUTION) ×3 IMPLANT
PACK LAP CHOLE LZT030E (CUSTOM PROCEDURE TRAY) ×3 IMPLANT
PAD ARMBOARD 7.5X6 YLW CONV (MISCELLANEOUS) ×3 IMPLANT
PENCIL HANDSWITCHING (ELECTRODE) ×2 IMPLANT
SET BASIN LINEN APH (SET/KITS/TRAYS/PACK) ×3 IMPLANT
SET TUBE SMOKE EVAC HIGH FLOW (TUBING) ×3 IMPLANT
SLEEVE ENDOPATH XCEL 5M (ENDOMECHANICALS) ×3 IMPLANT
SUT MNCRL AB 4-0 PS2 18 (SUTURE) ×5 IMPLANT
SUT VICRYL 0 UR6 27IN ABS (SUTURE) ×3 IMPLANT
SYR 20ML LL LF (SYRINGE) ×3 IMPLANT
SYR 30ML LL (SYRINGE) ×3 IMPLANT
SYR CONTROL 10ML LL (SYRINGE) ×3 IMPLANT
SYS BAG RETRIEVAL 10MM (BASKET) ×2
SYSTEM BAG RETRIEVAL 10MM (BASKET) ×1 IMPLANT
TROCAR ENDO BLADELESS 11MM (ENDOMECHANICALS) ×3 IMPLANT
TROCAR XCEL NON-BLD 5MMX100MML (ENDOMECHANICALS) ×3 IMPLANT
TROCAR XCEL UNIV SLVE 11M 100M (ENDOMECHANICALS) ×3 IMPLANT
WARMER LAPAROSCOPE (MISCELLANEOUS) ×3 IMPLANT
YANKAUER SUCT 12FT TUBE ARGYLE (SUCTIONS) ×3 IMPLANT

## 2019-09-08 NOTE — Discharge Instructions (Signed)
Discharge Laparoscopic Surgery Instructions:  Common Complaints: Right shoulder pain is common after laparoscopic surgery. This is secondary to the gas used in the surgery being trapped under the diaphragm.  Walk to help your body absorb the gas. This will improve in a few days. Pain at the port sites are common, especially the larger port sites. This will improve with time.  Some nausea is common and poor appetite. The main goal is to stay hydrated the first few days after surgery.   Diet/ Activity: Diet as tolerated. You may not have an appetite, but it is important to stay hydrated. Drink 64 ounces of water a day. Your appetite will return with time.  Shower per your regular routine daily.  Do not take hot showers. Take warm showers that are less than 10 minutes. Rest and listen to your body, but do not remain in bed all day.  Walk everyday for at least 15-20 minutes. Deep cough and move around every 1-2 hours in the first few days after surgery.  Do not lift > 10 lbs, perform excessive bending, pushing, pulling, squatting for 1-2 weeks after surgery.  Do not pick at the dermabond glue on your incision sites.  This glue film will remain in place for 1-2 weeks and will start to peel off.  Do not place lotions or balms on your incision unless instructed to specifically by Dr. Bridges.   Pain Expectations and Narcotics: -After surgery you will have pain associated with your incisions and this is normal. The pain is muscular and nerve pain, and will get better with time. -You are encouraged and expected to take non narcotic medications like tylenol and ibuprofen (when able) to treat pain as multiple modalities can aid with pain treatment. -Narcotics are only used when pain is severe or there is breakthrough pain. -You are not expected to have a pain score of 0 after surgery, as we cannot prevent pain. A pain score of 3-4 that allows you to be functional, move, walk, and tolerate some activity is  the goal. The pain will continue to improve over the days after surgery and is dependent on your surgery. -Due to [place] law, we are only able to give a certain amount of pain medication to treat post operative pain, and we only give additional narcotics on a patient by patient basis.  -For most laparoscopic surgery, studies have shown that the majority of patients only need 10-15 narcotic pills, and for open surgeries most patients only need 15-20.   -Having appropriate expectations of pain and knowledge of pain management with non narcotics is important as we do not want anyone to become addicted to narcotic pain medication.  -Using ice packs in the first 48 hours and heating pads after 48 hours, wearing an abdominal binder (when recommended), and using over the counter medications are all ways to help with pain management.   -Simple acts like meditation and mindfulness practices after surgery can also help with pain control and research has proven the benefit of these practices.  Medication: Take tylenol and ibuprofen as needed for pain control, alternating every 4-6 hours.  Example:  Tylenol 1000mg @ 6am, 12noon, 6pm, 12midnight (Do not exceed 4000mg of tylenol a day). Ibuprofen 800mg @ 9am, 3pm, 9pm, 3am (Do not exceed 3600mg of ibuprofen a day).  Take Roxicodone for breakthrough pain every 4 hours.  Take Colace for constipation related to narcotic pain medication. If you do not have a bowel movement in 2 days, take Miralax   over the counter.  Drink plenty of water to also prevent constipation.   Contact Information: If you have questions or concerns, please call our office, [phone], Monday- Thursday 8AM-5PM and Friday 8AM-12Noon.  If it is after hours or on the weekend, please call [facility], [phone], and ask to speak to the surgeon on call for Dr. Henreitta Leber at [facility].    Instrucciones de alta para la ciruga laparoscpica:  Michaell Cowing comunes: El dolor en el hombro derecho  es comn despus de la ciruga laparoscpica. Esto es secundario a que el gas utilizado en la ciruga queda atrapado debajo del diafragma. Camine para ayudar a su cuerpo a absorber el gas. Esto mejorar en [employer]. El dolor en los sitios portuarios es comn, especialmente en los sitios portuarios ms grandes. Esto mejorar con [employer]. Algunas nuseas son comunes y falta de apetito. El objetivo principal es mantenerse hidratado los primeros das despus de la [place].  Dieta / Actividad: Dieta segn la tolerancia. Es posible que no tenga apetito, pero es importante mantenerse hidratado. Beba 64 onzas de agua al [employer]. Tu apetito volver con Museum/gallery conservator. Dchese segn su rutina habitual todos los [place]. No tome duchas calientes. Tome duchas tibias que duren menos de 10 minutos. Descanse y escuche a su cuerpo, pero no permanezca en la cama todo Medical laboratory scientific officer. Camine [employer] al menos 15 a 20 minutos. Toser profundamente y [employer] cada 1-2 horas durante los primeros [address] despus de la [place]. No levante ms de 10 libras, realice flexiones, empujes, tirones o sentadillas excesivos durante 1-2 semanas despus de la [place]. No toque el pegamento dermabond en los sitios de la incisin. Esta pelcula de pegamento permanecer en su lugar durante 1-2 semanas y comenzar a desprenderse. No coloque lociones o blsamos en su incisin a menos que se lo indique especficamente el Dr. Henreitta Leber.  Expectativas de Engineer, mining y narcticos: -Despus de la ciruga, tendr dolor asociado con sus incisiones y esto es normal. El dolor es muscular y [place], y mejorar con Museum/gallery conservator. -Se le recomienda y se espera que tome medicamentos no narcticos como tylenol e ibuprofeno (cuando sea posible) para tratar Chief Technology Officer, ya que mltiples modalidades pueden ayudar con el tratamiento del Engineer, mining. -Los narcticos solo se usan cuando el dolor es severo o hay dolor irruptivo. -No se espera que tenga una puntuacin de Engineer, mining de 0  despus de la ciruga, ya que no podemos prevenir Chief Technology Officer. El objetivo es una puntuacin de Engineer, mining de 3-4 que le permita ser funcional, [place], caminar y Therapist, art. El Gaffer mejorando [employer] posteriores a la [place] y depende de su [place]. -Debido a la ley de [place], solo podemos administrar una cierta cantidad de analgsicos para tratar Chief Technology Officer posoperatorio, y solo administramos narcticos adicionales paciente por paciente. -Para la mayora de las cirugas laparoscpicas, los estudios han demostrado que la mayora de los pacientes solo necesitan de 10 a 15 pastillas narcticas, y para las [address], la mayora de los pacientes solo necesitan de 15 a 20 pastillas. -Tener expectativas adecuadas sobre el dolor y conocimiento del manejo del Engineer, mining con no narcticos es importante ya que no queremos que nadie se vuelva adicto a los analgsicos narcticos. -El uso de bolsas de hielo en las primeras 48 horas y almohadillas trmicas despus de las 48 horas, el uso de una faja abdominal (cuando se recomiende) y el uso de medicamentos de venta libre son formas de Contractor con  el manejo del dolor. -Actos simples como la meditacin y las prcticas de atencin plena despus de la ciruga tambin pueden ayudar con el control del dolor y la investigacin ha demostrado el beneficio de Scientist, physiological.  Medicamento: Tome tylenol e ibuprofeno segn sea necesario para controlar el dolor, alternando cada 4-6 horas. Ejemplo: Tylenol 1000 mg a las 6 a. M., A las 12 del medioda, a las 6 p. M., A las 12 de la medianoche (No exceda los 4000 mg de tylenol al da). Ibuprofeno 800 mg a las 9 a. M., 3 p. M., 9 p. M., 3 a. M. (No exceda los 3600 mg de ibuprofeno al da). Tome Roxicodone para el dolor irruptivo cada 4 horas. Tome Colace para el estreimiento relacionado con analgsicos narcticos. Si no tiene una evacuacin intestinal en 2 das, tome Miralax sin receta. Birdie Hopes agua para prevenir tambin el estreimiento.  Informacin del contacto: Si tiene preguntas o inquietudes, llame a Rolm Gala, [phone], de lunes a jueves de 8 a. M. A 5 p. M. Y los viernes de 8 a. M. A 12 del medioda.Si es fuera del horario de atencin o durante el fin de [employer], llame al nmero principal de Alicia, [phone], y pida hablar con el cirujano de guardia del Dr. Henreitta Leber en [facility]king.   Colecistectoma laparoscpica, cuidados posteriores Laparoscopic Cholecystectomy, Care After Lea esta informacin sobre cmo cuidarse despus del procedimiento. El mdico tambin podr darle indicaciones ms especficas. Si tiene problemas o preguntas, llame al mdico. Siga estas indicaciones en su casa: Cuidado de los cortes de la ciruga (incisiones)   Siga las indicaciones del mdico en lo que respecta al cuidado de los cortes de la [place]. Haga lo siguiente: ? Lvese las manos con agua y jabn antes de Multimedia programmer las vendas (vendaje). Use un desinfectante para manos si no dispone de [place] y [place]. ? Cambie el vendaje como se lo haya indicado el mdico. ? No retire los puntos (suturas), la goma para cerrar la piel o las tiras [place]. Tal vez deban dejarse puestos en la piel durante 2semanas o ms tiempo. Si las tiras [place] se despegan y se enroscan, puede recortar los bordes sueltos. No retire las tiras [employer] por completo a menos que el mdico lo autorice.  No tome baos de inmersin, no practique natacin ni use el jacuzzi hasta que el mdico lo autorice.   Puedes ducharte.  Controle la zona alrededor del corte todos los das para detectar signos de infeccin. Est atento a los siguientes signos: ? Aumento del enrojecimiento, de la hinchazn o del dolor. ? Ms lquido [place]. ? Calor. ? Pus o mal olor. Actividad  No conduzca ni use maquinaria pesada mientras toma analgsicos recetados.  No levante ningn objeto que pese ms de 10libras (4,5kg) hasta que el  mdico lo autorice.  No practique deportes de contacto hasta que el mdico lo autorice.  No conduzca durante 24horas si le dieron un medicamento para ayudarlo a que se relaje (sedante).  Descanse todo lo que sea necesario. No retome el trabajo ni el estudio hasta que el mdico lo autorice. Instrucciones generales  [employer] de venta libre y los recetados solamente como se lo haya indicado el mdico.  A fin de prevenir o tratar el estreimiento mientras toma analgsicos recetados, el mdico puede recomendarle lo siguiente: ? Product manager suficiente lquido para Pharmacologist el pis (orina) claro o de color amarillo plido. ? Tomar medicamentos recetados o de [employer]. ? Consumir alimentos ricos  en fibra, como frutas y verduras frescas, cereales integrales y frijoles. ? Limitar el consumo de alimentos con alto contenido de grasas y azcares procesados, como alimentos fritos o dulces. Comunquese con un mdico si:  Le aparece una erupcin cutnea.  Tiene ms enrojecimiento, hinchazn o dolor alrededor [employer] cortes de la [place].  Aumenta la cantidad de lquido o sangre que sale de los cortes.  Los cortes de la ciruga se sienten calientes al tacto.  Tiene pus o percibe mal olor que [employer] cortes.  Tiene fiebre.  Se abren uno o ms de los cortes. Solicite ayuda de inmediato si:  Tiene dificultad para respirar.  Siente dolor en el pecho.  Siente dolor que empeora en la zona de los hombros.  Se desmaya o se siente mareado al ponerse de pie.  Tiene dolor muy intenso de vientre (abdomen).  Tiene malestar estomacal (nuseas) que dura ms de Optician, dispensing.  Vomita durante ms de Optician, dispensing.  Siente dolor en la pierna. Esta informacin no tiene Marine scientist el consejo del mdico. Asegrese de hacerle al mdico cualquier pregunta que tenga. Document Revised: 07/29/2016 Document Reviewed: 10/09/2015 Elsevier Patient Education  2020 [place]  Anesthesia, Adult, Care After This sheet gives you information about how to care for yourself after your procedure. Your health care provider may also give you more specific instructions. If you have problems or questions, contact your health care provider. What can I expect after the procedure? After the procedure, the following side effects are common:  Pain or discomfort at the IV site.  Nausea.  Vomiting.  Sore throat.  Trouble concentrating.  Feeling cold or chills.  Weak or tired.  Sleepiness and fatigue.  Soreness and body aches. These side effects can affect parts of the body that were not involved in surgery. Follow these instructions at home:  For at least 24 hours after the procedure:  Have a responsible adult stay with you. It is important to have someone help care for you until you are awake and alert.  Rest as needed.  Do not: ? Participate in activities in which you could fall or become injured. ? Drive. ? Use heavy machinery. ? Drink alcohol. ? Take sleeping pills or medicines that cause drowsiness. ? Make important decisions or sign legal documents. ? Take care of children on your own. Eating and drinking  Follow any instructions from your health care provider about eating or drinking restrictions.  When you feel hungry, start by eating small amounts of foods that are soft and easy to digest (bland), such as toast. Gradually return to your regular diet.  Drink enough fluid to keep your urine pale yellow.  If you vomit, rehydrate by drinking water, juice, or clear broth. General instructions  If you have sleep apnea, surgery and certain medicines can increase your risk for breathing problems. Follow instructions from your health care provider about wearing your sleep device: ? Anytime you are sleeping, including during daytime naps. ? While taking prescription pain medicines, sleeping medicines, or medicines that make you drowsy.  Return to your  normal activities as told by your health care provider. Ask your health care provider what activities are safe for you.  Take over-the-counter and prescription medicines only as told by your health care provider.  If you smoke, do not smoke without supervision.  Keep all follow-up visits as told by your health care provider. This is important. Contact a health care  provider if:  You have nausea or vomiting that does not get better with medicine.  You cannot eat or drink without vomiting.  You have pain that does not get better with medicine.  You are unable to pass urine.  You develop a skin rash.  You have a fever.  You have redness around your IV site that gets worse. Get help right away if:  You have difficulty breathing.  You have chest pain.  You have blood in your urine or stool, or you vomit blood. Summary  After the procedure, it is common to have a sore throat or nausea. It is also common to feel tired.  Have a responsible adult stay with you for the first 24 hours after general anesthesia. It is important to have someone help care for you until you are awake and alert.  When you feel hungry, start by eating small amounts of foods that are soft and easy to digest (bland), such as toast. Gradually return to your regular diet.  Drink enough fluid to keep your urine pale yellow.  Return to your normal activities as told by your health care provider. Ask your health care provider what activities are safe for you. This information is not intended to replace advice given to you by your health care provider. Make sure you discuss any questions you have with your health care provider. Document Revised: 04/25/2017 Document Reviewed: 12/06/2016 Elsevier Patient Education  2020 [employer].

## 2019-09-08 NOTE — Interval H&P Note (Signed)
History and Physical Interval Note:  09/08/2019 7:24 AM  Stefanie Burgess  has presented today for surgery, with the diagnosis of Cholelithiasis.  The various methods of treatment have been discussed with the patient and family. After consideration of risks, benefits and other options for treatment, the patient has consented to  Procedure(s): LAPAROSCOPIC CHOLECYSTECTOMY WITH INTRAOPERATIVE CHOLANGIOGRAM (N/A) as a surgical intervention.  The patient's history has been reviewed, patient examined, no change in status, stable for surgery.  I have reviewed the patient's chart and labs.  Questions were answered to the patient's satisfaction.    No changes or concerns.  [name]

## 2019-09-08 NOTE — Anesthesia Postprocedure Evaluation (Signed)
Anesthesia Post Note  Patient: Stefanie Burgess  Procedure(s) Performed: LAPAROSCOPIC CHOLECYSTECTOMY WITH INTRAOPERATIVE CHOLANGIOGRAM (N/A )  Patient location during evaluation: PACU Anesthesia Type: General Level of consciousness: awake and alert and oriented Pain management: pain level controlled Vital Signs Assessment: post-procedure vital signs reviewed and stable Respiratory status: spontaneous breathing Postop Assessment: no apparent nausea or vomiting Anesthetic complications: no     Last Vitals:  Vitals:   09/08/19 0900 09/08/19 0915  BP: 115/79   Pulse: 96 82  Resp: 17 18  Temp:    SpO2: 100% 100%    Last Pain:  Vitals:   09/08/19 0915  TempSrc:   PainSc: 7                  Mehki Klumpp

## 2019-09-08 NOTE — Op Note (Signed)
Operative Note   Preoperative Diagnosis: Symptomatic cholelithiasis, common bile duct dilation    Postoperative Diagnosis: Same   Procedure(s) Performed: Laparoscopic cholecystectomy, intraoperative cholangiogram    Surgeon: Leatrice Jewels. Henreitta Leber, MD   Assistants: Franky Macho, MD    Anesthesia: General endotracheal   Anesthesiologist: Windell Norfolk, MD    Specimens: Gallbladder    Estimated Blood Loss: Minimal    Blood Replacement: None    Complications: None    Operative Findings: Gallbladder with stones, normal cholangiogram    Procedure: The patient was taken to the operating room and placed supine. General endotracheal anesthesia was induced. Intravenous antibiotics were administered per protocol. An orogastric tube positioned to decompress the stomach. The abdomen was prepared and draped in the usual sterile fashion.    A supraumbilical incision was made and a Veress technique was utilized to achieve pneumoperitoneum to 15 mmHg with carbon dioxide. A 11 mm optiview port was placed through the supraumbilical region, and a 10 mm 0-degree operative laparoscope was introduced. The area underlying the trocar and Veress needle were inspected and without evidence of injury.  Remaining trocars were placed under direct vision. Two 5 mm ports were placed in the right abdomen, between the anterior axillary and midclavicular line.  A final 11 mm port was placed through the mid-epigastrium, near the falciform ligament.    The gallbladder fundus was elevated cephalad and the infundibulum was retracted to the patient's right. The gallbladder/cystic duct junction was skeletonized. The cystic artery noted in the triangle of Calot and was also skeletonized.  We then continued liberal medial and lateral dissection until the critical view of safety was achieved.    The cystic artery was triply clipped and divided.  The cystic duct was clipped distal and scissors were used to make a cystotomy.  The  cholangiogram catheter and clamp were placed into the cystotomy and flushed with saline. This flushed well. A cholangiogram was performed demonstrated a long cystic duct, a dilated common bile duct without filling defect, filling into the duodenum and reflux into the hepatic ducts.    From here the cholangiocatheter and clamp were removed, and the cystic duct was triply clipped and divided.  The gallbladder was then dissected from the liver bed with electrocautery. The specimen was placed in an Endopouch and was retrieved through the epigastric site. There was some minor spillage of stones that were all collected.    Final inspection revealed acceptable hemostasis. Surgical SNOW was placed in the gallbladder bed.  Trocars were removed and pneumoperitoneum was released.  0 Vicryl fascial sutures were used to close the epigastric and umbilical port sites. Skin incisions were closed with 4-0 Monocryl subcuticular sutures and Dermabond. The patient was awakened from anesthesia and extubated without complication.    Algis Greenhouse, MD [facility] [address] [place], [place] [address] [phone] (office)   \

## 2019-09-08 NOTE — Progress Notes (Signed)
[facility]  Spoke with [facility] husband and notified surgery complete. Rx sent to [facility] on [address].   Algis Greenhouse, MD [facility] [address] [place] [place], [place] [address] [phone] (office)

## 2019-09-08 NOTE — Addendum Note (Signed)
Addendum  created 09/08/19 0946 by Moshe Salisbury, CRNA   Flowsheet accepted, Intraprocedure Flowsheets edited

## 2019-09-08 NOTE — Transfer of Care (Signed)
Immediate Anesthesia Transfer of Care Note  Patient: Stefanie Burgess  Procedure(s) Performed: LAPAROSCOPIC CHOLECYSTECTOMY WITH INTRAOPERATIVE CHOLANGIOGRAM (N/A )  Patient Location: PACU  Anesthesia Type:General  Level of Consciousness: awake  Airway & Oxygen Therapy: Patient Spontanous Breathing  Post-op Assessment: Report given to RN  Post vital signs: Reviewed  Last Vitals:  Vitals Value Taken Time  BP 119/71 09/08/19 0853  Temp    Pulse 65 09/08/19 0854  Resp 17 09/08/19 0854  SpO2 98 % 09/08/19 0854  Vitals shown include unvalidated device data.  Last Pain:  Vitals:   09/08/19 0701  TempSrc: Oral  PainSc: 0-No pain      Patients Stated Pain Goal: 8 (09/08/19 0701)  Complications: No apparent anesthesia complications

## 2019-09-08 NOTE — Anesthesia Preprocedure Evaluation (Signed)
Anesthesia Evaluation  Patient identified by MRN, date of birth, ID band Patient awake    Reviewed: Allergy & Precautions, H&P , NPO status , Patient's Chart, lab work & pertinent test results, reviewed documented beta blocker date and time   Airway Mallampati: I  TM Distance: >3 FB Neck ROM: full    Dental no notable dental hx. (+) Teeth Intact   Pulmonary neg pulmonary ROS,    Pulmonary exam normal breath sounds clear to auscultation       Cardiovascular Exercise Tolerance: Good negative cardio ROS   Rhythm:regular Rate:Normal     Neuro/Psych negative neurological ROS  negative psych ROS   GI/Hepatic Neg liver ROS, GERD  Medicated,  Endo/Other  negative endocrine ROS  Renal/GU negative Renal ROS  negative genitourinary   Musculoskeletal   Abdominal   Peds  Hematology negative hematology ROS (+)   Anesthesia Other Findings   Reproductive/Obstetrics negative OB ROS                             Anesthesia Physical Anesthesia Plan  ASA: II  Anesthesia Plan: General   Post-op Pain Management:    Induction:   PONV Risk Score and Plan: 3 and Ondansetron  Airway Management Planned:   Additional Equipment:   Intra-op Plan:   Post-operative Plan:   Informed Consent: I have reviewed the patients History and Physical, chart, labs and discussed the procedure including the risks, benefits and alternatives for the proposed anesthesia with the patient or authorized representative who has indicated his/her understanding and acceptance.     Dental Advisory Given  Plan Discussed with: CRNA  Anesthesia Plan Comments:         Anesthesia Quick Evaluation

## 2019-09-08 NOTE — Anesthesia Procedure Notes (Signed)
Procedure Name: Intubation Date/Time: 09/08/2019 7:36 AM Performed by: Ollen Bowl, CRNA Pre-anesthesia Checklist: Patient identified, Patient being monitored, Timeout performed, Emergency Drugs available and Suction available Patient Re-evaluated:Patient Re-evaluated prior to induction Oxygen Delivery Method: Circle system utilized Preoxygenation: Pre-oxygenation with 100% oxygen Induction Type: IV induction Ventilation: Mask ventilation without difficulty Laryngoscope Size: Mac and 3 Grade View: Grade I Tube type: Oral Tube size: 7.0 mm Number of attempts: 1 Airway Equipment and Method: Stylet Placement Confirmation: ETT inserted through vocal cords under direct vision,  positive ETCO2 and breath sounds checked- equal and bilateral Secured at: 21 cm Tube secured with: Tape Dental Injury: Teeth and Oropharynx as per pre-operative assessment

## 2019-09-09 LAB — SURGICAL PATHOLOGY

## 2019-09-21 ENCOUNTER — Telehealth: Payer: Self-pay

## 2019-09-21 NOTE — Telephone Encounter (Signed)
Spoke with the patient and her husband plans on returning to work on 10/05/2019. He has been out of work helping her after her surgery on 09/08/19.  FMLA paperwork has been faxed to [facility]. [place]: [phone] Fax: [phone] Attention Kendal Hymen  He will pick up these forms tomorrow.

## 2019-09-23 ENCOUNTER — Telehealth (INDEPENDENT_AMBULATORY_CARE_PROVIDER_SITE_OTHER): Payer: Self-pay | Admitting: General Surgery

## 2019-09-23 DIAGNOSIS — K8010 Calculus of gallbladder with chronic cholecystitis without obstruction: Secondary | ICD-10-CM

## 2019-09-23 NOTE — Progress Notes (Signed)
[facility]  I am calling the patient for post operative evaluation due to the current COVID 19 pandemic.  The patient had a laparoscopic cholecystectomy on 09/08/2019. The patient reports that they are doing well. The are tolerating a diet, having good pain control, and having regular Bms.  The patient has somes concerns about black area under her cuts? I think this is probably dried blood or bruise but I will check on her next week.   Will see the patient PRN.   I spoke to her in spanish to the best of my ability.   Pathology: FINAL MICROSCOPIC DIAGNOSIS:   A. GALLBLADDER, CHOLECYSTECTOMY:  - Chronic cholecystitis with cholelithiasis.   5/25 @ 11AM   Algis Greenhouse, MD [facility] [address] [place], [place] [address] [phone] (office)

## 2019-09-28 ENCOUNTER — Ambulatory Visit (INDEPENDENT_AMBULATORY_CARE_PROVIDER_SITE_OTHER): Payer: Self-pay | Admitting: General Surgery

## 2019-09-28 ENCOUNTER — Other Ambulatory Visit: Payer: Self-pay

## 2019-09-28 VITALS — BP 113/78 | HR 73 | Temp 96.4°F | Resp 14 | Ht 62.0 in | Wt 127.0 lb

## 2019-09-28 DIAGNOSIS — K8010 Calculus of gallbladder with chronic cholecystitis without obstruction: Secondary | ICD-10-CM

## 2019-09-28 NOTE — Patient Instructions (Addendum)
Diet and activity as tolerated. Dieta y [place] segn tolerancia.

## 2019-09-29 NOTE — Progress Notes (Signed)
[facility] Note   HPI:  39 y.o. Female presents to clinic for post-op follow-up evaluation. She was worried about her incisions on the post op phone call.   Review of Systems:  No drainage Black area under incision All other review of systems: otherwise negative   Vital Signs:  BP 113/78   Pulse 73   Temp (!) 96.4 F (35.8 C) (Temporal)   Resp 14   Ht 5\' 2"  (1.575 m)   Wt 127 lb (57.6 kg)   SpO2 99%   BMI 23.23 kg/m    Physical Exam:  Physical Exam Vitals reviewed.  HENT:     Head: Normocephalic.     Nose: Nose normal.  Abdominal:     Comments: Incisions healing, dried blood on the inferior aspect of the umbilical port site, no erythema or drainage from any sites  Neurological:     Mental Status: She is alert.    Assessment:  39 y.o. yo Female s/p laparoscopic cholecystectomy for chronic cholecystitis. She had concerns about her incisions, and there is dried blood but nothing worrisome.   Plan:  - Reassurance given  - Activity and diet as tolerated - PRN follow up    24, MD [facility] [address] [place], [place] [phone] (office)

## 2020-01-26 ENCOUNTER — Ambulatory Visit: Payer: Medicaid Other | Admitting: Gastroenterology

## 2021-05-11 ENCOUNTER — Other Ambulatory Visit (HOSPITAL_COMMUNITY): Payer: Self-pay | Admitting: *Deleted

## 2021-05-11 DIAGNOSIS — Z1231 Encounter for screening mammogram for malignant neoplasm of breast: Secondary | ICD-10-CM

## 2021-05-28 ENCOUNTER — Other Ambulatory Visit: Payer: Self-pay

## 2021-05-28 ENCOUNTER — Ambulatory Visit (HOSPITAL_COMMUNITY)
Admission: RE | Admit: 2021-05-28 | Discharge: 2021-05-28 | Disposition: A | Discharge status: Home / Self Care | Payer: Self-pay | Source: Ambulatory Visit | Attending: *Deleted | Admitting: *Deleted

## 2021-05-28 DIAGNOSIS — Z1231 Encounter for screening mammogram for malignant neoplasm of breast: Secondary | ICD-10-CM | POA: Insufficient documentation

## 2022-06-27 ENCOUNTER — Other Ambulatory Visit (HOSPITAL_COMMUNITY): Payer: Self-pay | Admitting: Nurse Practitioner

## 2022-06-27 DIAGNOSIS — Z1231 Encounter for screening mammogram for malignant neoplasm of breast: Secondary | ICD-10-CM

## 2022-07-19 ENCOUNTER — Ambulatory Visit (HOSPITAL_COMMUNITY): Payer: Medicaid Other

## 2022-07-22 ENCOUNTER — Ambulatory Visit (HOSPITAL_COMMUNITY)
Admission: RE | Admit: 2022-07-22 | Discharge: 2022-07-22 | Disposition: A | Discharge status: Home / Self Care | Payer: Medicaid Other | Source: Ambulatory Visit | Attending: Nurse Practitioner | Admitting: Nurse Practitioner

## 2022-07-22 DIAGNOSIS — Z1231 Encounter for screening mammogram for malignant neoplasm of breast: Secondary | ICD-10-CM | POA: Diagnosis present

## 2022-08-24 IMAGING — MG MM DIGITAL SCREENING BILAT W/ TOMO AND CAD
8 series · 8 of 24 positions shown · non-contrast
Comparison: Previous exam(s).

CLINICAL DATA: Screening.

EXAM:
DIGITAL SCREENING BILATERAL MAMMOGRAM WITH TOMOSYNTHESIS AND CAD
TECHNIQUE: Bilateral screening digital craniocaudal and mediolateral oblique
mammograms were obtained. Bilateral screening digital breast
tomosynthesis was performed. The images were evaluated with
computer-aided detection.

[R MLO synth-2D]
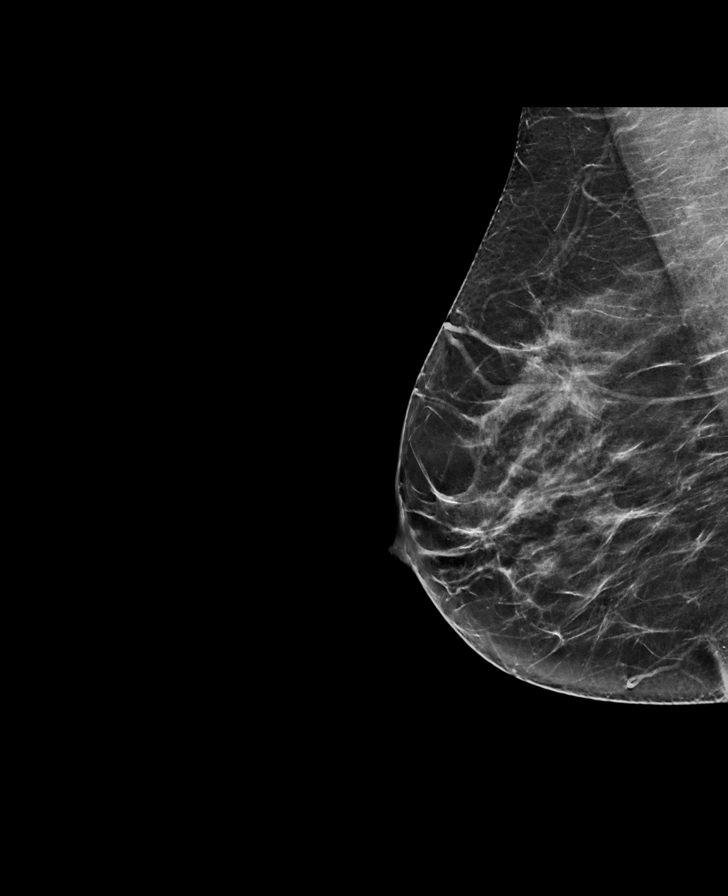

[L CC synth-2D]
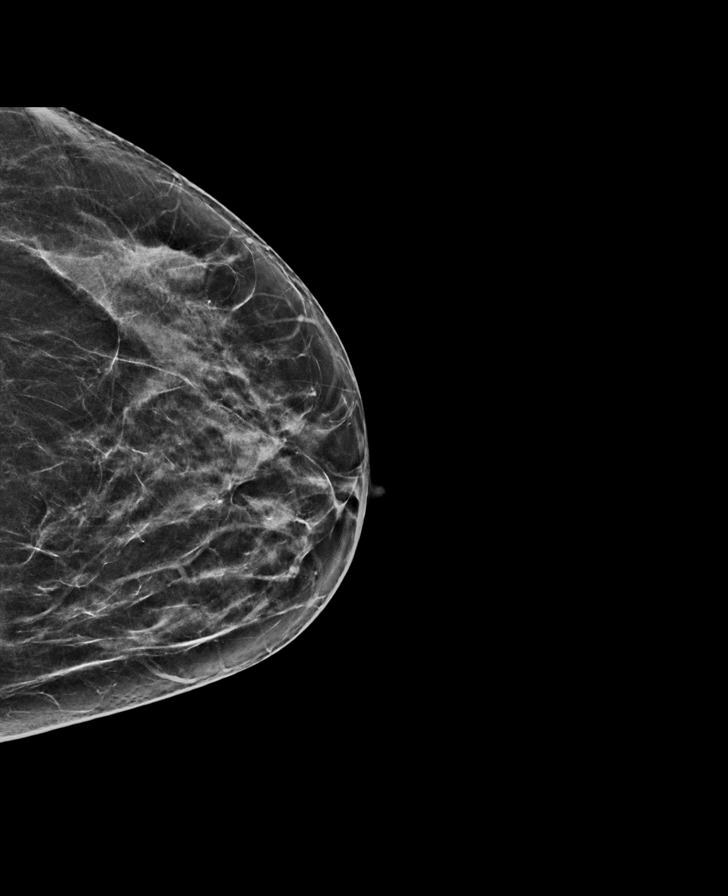

[R CC synth-2D]
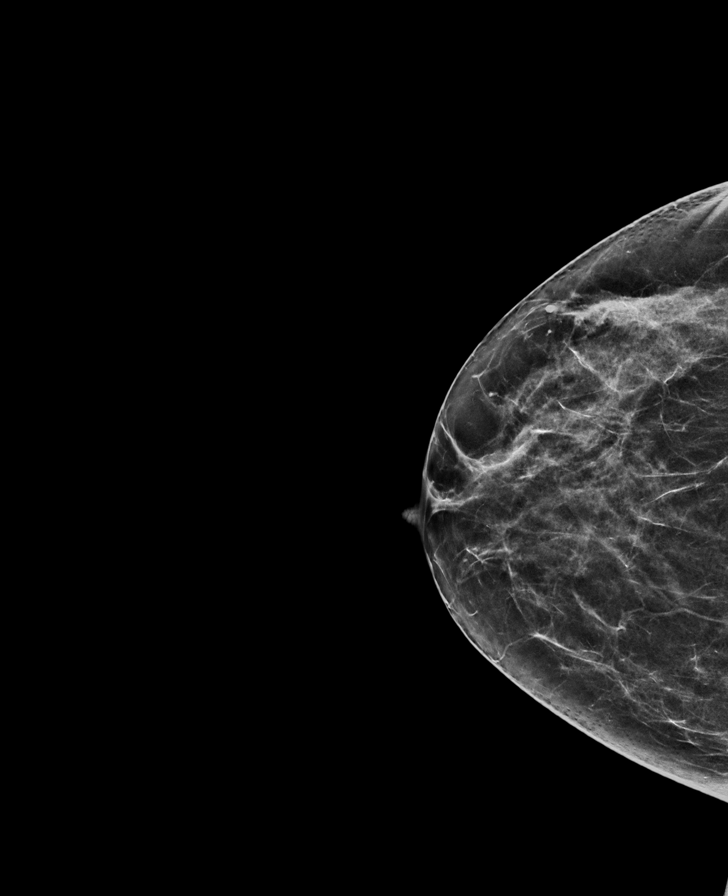

[L MLO synth-2D]
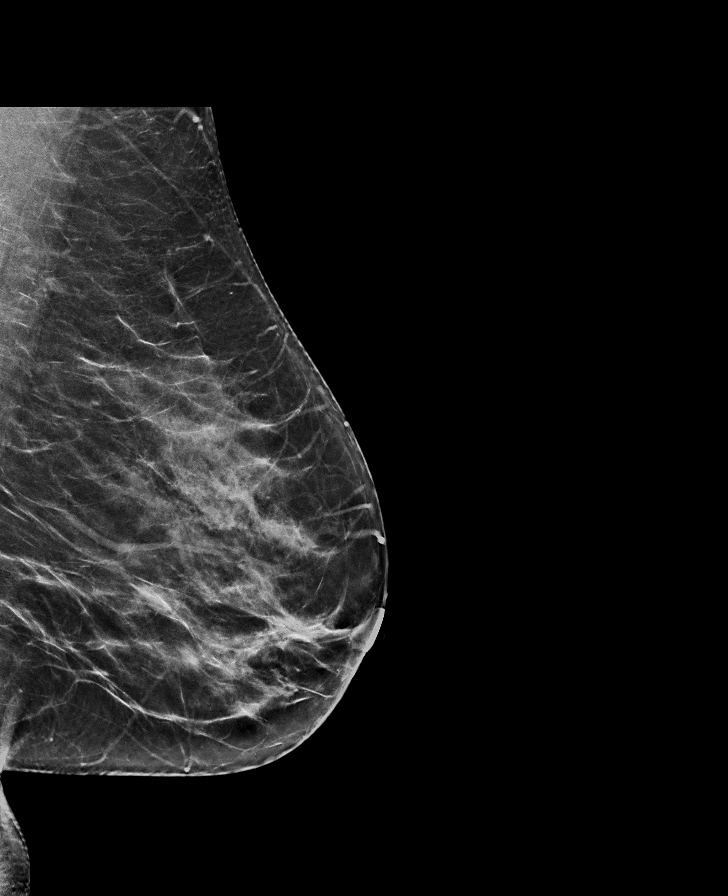

[R CC tomo · tomo slice 31/60.0]
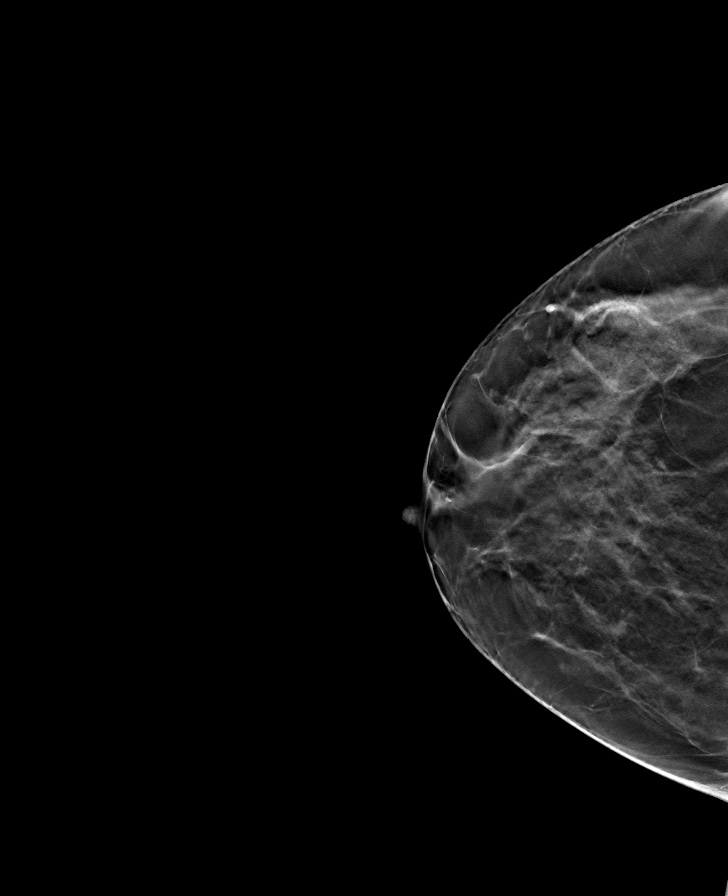

[L MLO tomo · tomo slice 34/67.0]
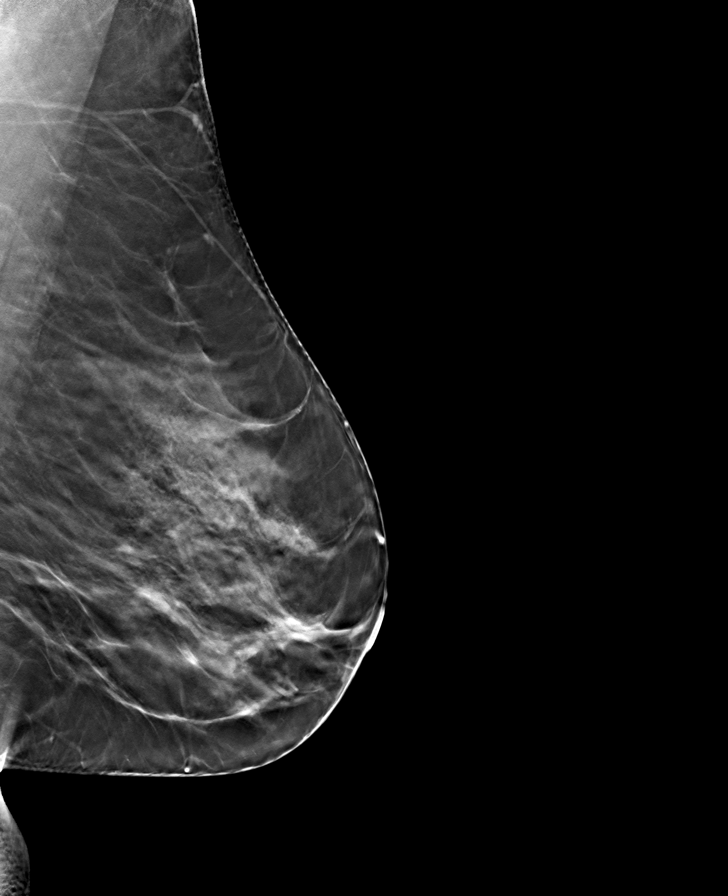

[L CC tomo · tomo slice 31/62.0]
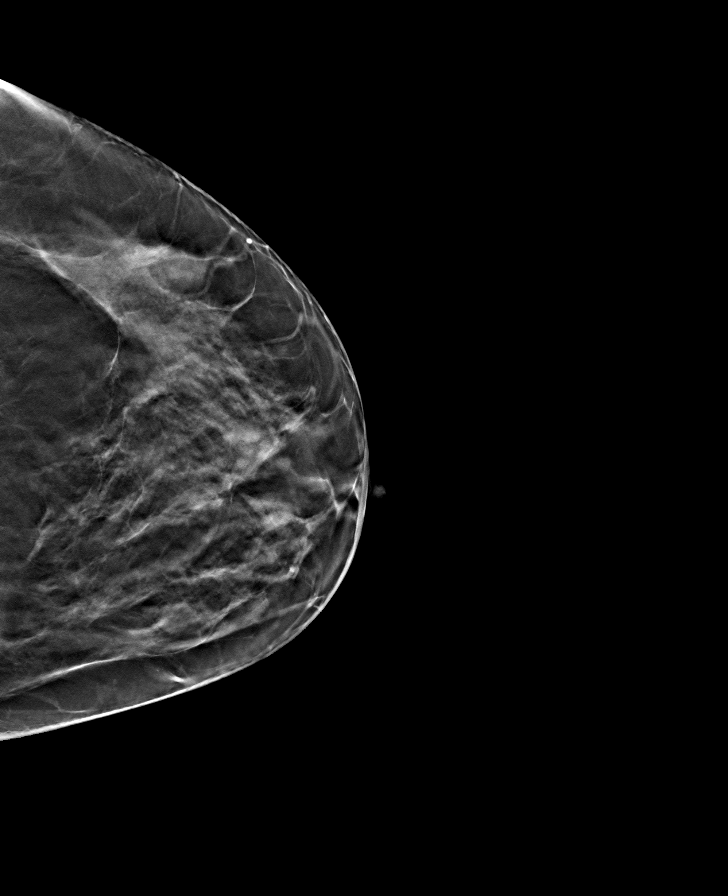

[R MLO tomo · tomo slice 34/67.0]
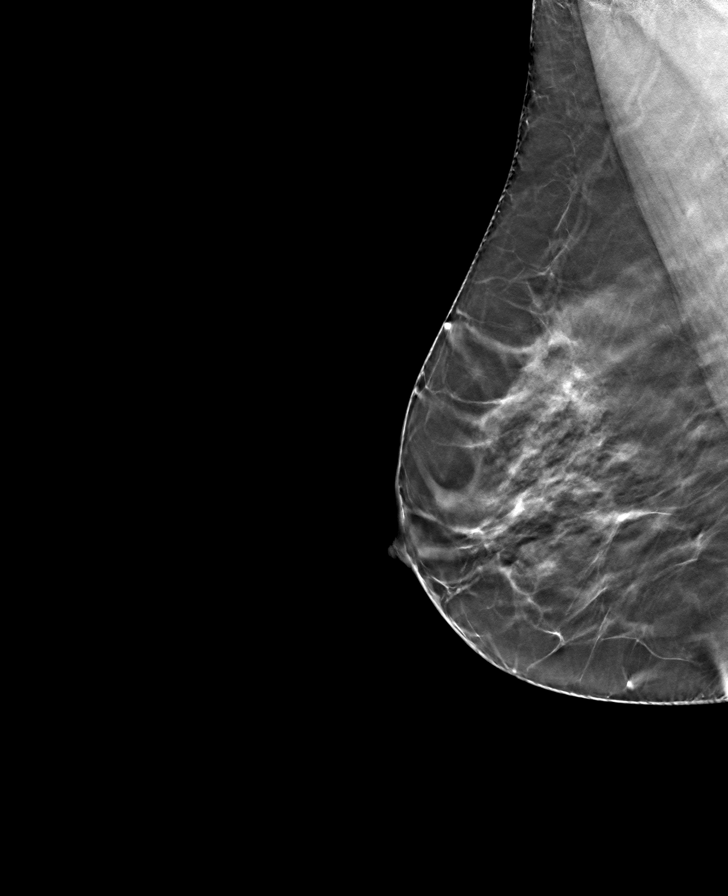

[8 of 24 positions shown; findings below may reference images not displayed]

ACR Breast Density Category c: The breast tissue is heterogeneously
dense, which may obscure small masses.
FINDINGS: There are no findings suspicious for malignancy.
IMPRESSION: No mammographic evidence of malignancy. A result letter of this
screening mammogram will be mailed directly to the patient.

RECOMMENDATION:
Screening mammogram in one year. (Code:Q3-W-BC3)

BI-RADS CATEGORY  1: Negative.

## 2023-07-07 ENCOUNTER — Other Ambulatory Visit: Payer: Self-pay

## 2023-07-07 ENCOUNTER — Emergency Department (HOSPITAL_COMMUNITY)
Admission: EM | Admit: 2023-07-07 | Discharge: 2023-07-07 | Disposition: A | Discharge status: Home / Self Care | Attending: Student | Admitting: Student

## 2023-07-07 ENCOUNTER — Encounter (HOSPITAL_COMMUNITY): Payer: Self-pay

## 2023-07-07 DIAGNOSIS — H7291 Unspecified perforation of tympanic membrane, right ear: Secondary | ICD-10-CM | POA: Diagnosis not present

## 2023-07-07 DIAGNOSIS — H9201 Otalgia, right ear: Secondary | ICD-10-CM | POA: Diagnosis present

## 2023-07-07 MED ORDER — NAPROXEN 250 MG PO TABS
500.0000 mg | ORAL_TABLET | Freq: Once | ORAL | Status: AC
  Administered 2023-07-07: 500 mg via ORAL
  Filled 2023-07-07: qty 2

## 2023-07-07 MED ORDER — NAPROXEN 375 MG PO TABS: 375.0000 mg | ORAL_TABLET | Freq: Two times a day (BID) | ORAL | 0 refills | Status: AC

## 2023-07-07 MED ORDER — OFLOXACIN 0.3 % OP SOLN
5.0000 [drp] | Freq: Every day | OPHTHALMIC | Status: DC
  Administered 2023-07-07: 5 [drp] via OTIC
  Filled 2023-07-07: qty 5

## 2023-07-07 NOTE — Discharge Instructions (Addendum)
 Use 5 drops in right ear twice daily for one week

## 2023-07-07 NOTE — ED Provider Notes (Signed)
 [place] EMERGENCY DEPARTMENT AT [facility] Provider Note  CSN: [id number] Arrival date & time: 07/07/23 0032  Chief Complaint(s) Otalgia  HPI Stefanie Burgess is a 43 y.o. female with PMH GERD who presents emergency room for evaluation of right ear pain.  States that for the last 2 days she has had persistent right ear pain and headache.  States that she went to the department public health who gave her an earwax removal kit.  She uses medication with the bulb flushed and had a sudden worsening of her right ear pain and now hears a "whooshing" sound in the right ear.  Endorses some mild hearing loss in the right ear.  Denies any additional symptoms including chest pain, shortness of breath, abdominal pain, nausea, vomiting or other systemic symptoms.   Past Medical History Past Medical History:  Diagnosis Date   GERD (gastroesophageal reflux disease)    Patient Active Problem List   Diagnosis Date Noted   Common bile duct dilation    Cholelithiasis 07/28/2019   Dyspepsia 06/02/2019   [redacted] weeks gestation of pregnancy 08/08/2017   Encounter for nuchal translucency testing 08/08/2017   Home Medication(s) Prior to Admission medications   Medication Sig Start Date End Date Taking? Authorizing Provider  naproxen (NAPROSYN) 375 MG tablet Take 1 tablet (375 mg total) by mouth 2 (two) times daily. 07/07/23  Yes Atiana Levier, MD  acetaminophen (TYLENOL) 500 MG tablet Take 500 mg by mouth every 6 (six) hours as needed (for pain.).    [provider]  alum & mag hydroxide-simeth (MAALOX/MYLANTA) 200-200-20 MG/5ML suspension Take 30 mLs by mouth every 6 (six) hours as needed for indigestion or heartburn.     [provider]  calcium carbonate (TUMS - DOSED IN MG ELEMENTAL CALCIUM) 500 MG chewable tablet Chew 1-2 tablets by mouth 3 (three) times daily as needed for indigestion or heartburn.    [provider]  Camphor-Eucalyptus-Menthol (VICKS VAPORUB EX) Apply 1  application topically 4 (four) times daily as needed (congestion).    [provider]  HEARTBURN RELIEF MAX ST 20 MG tablet Take 20 mg by mouth at bedtime.  08/11/19   [provider]  Homeopathic Products (ARNICA EX) Apply 1 application topically 4 (four) times daily as needed (bruises).    [provider]  Multiple Vitamin (MULTIVITAMIN WITH MINERALS) TABS tablet Take 1 tablet by mouth daily.    [provider]  Probiotic Product (PROBIOTIC PO) Take 1 capsule by mouth every other day.    [provider]  simethicone (GAS-X) 80 MG chewable tablet Chew 80-160 mg by mouth every 6 (six) hours as needed for flatulence.    [provider]                                                                                                                                    Past Surgical History Past Surgical History:  Procedure Laterality Date   BIOPSY  06/15/2019   Procedure: BIOPSY;  Surgeon: Corbin Ade, MD;  Location: [facility];  Service: Endoscopy;;   CHOLECYSTECTOMY N/A 09/08/2019   Procedure: LAPAROSCOPIC CHOLECYSTECTOMY WITH INTRAOPERATIVE CHOLANGIOGRAM;  Surgeon: [name], MD;  Location: [facility] ORS;  Service: General;  Laterality: N/A;   ESOPHAGOGASTRODUODENOSCOPY N/A 06/15/2019   normal esophagus, gastritis s/p biopsy, negative H.pylori, normal duodenum.    lipoma removal     as child from arm   Family History Family History  Problem Relation Age of Onset   Hyperlipidemia Mother    Colitis Mother    Hypertension Mother    Colon cancer Neg Hx    Colon polyps Neg Hx     Social History Social History   Tobacco Use   Smoking status: Never   Smokeless tobacco: Never  Substance Use Topics   Alcohol use: Never   Drug use: Never   Allergies Ranitidine  Review of Systems Review of Systems  HENT:  Positive for ear pain and hearing loss.     Physical Exam Vital Signs  I have reviewed the triage vital signs BP 138/89  (BP Location: Left Arm)   Pulse 88   Temp 98.3 F (36.8 C) (Oral)   Resp 17   Ht 5\' 2"  (1.575 m)   Wt 70.8 kg   LMP 06/10/2023 (Exact Date)   SpO2 97%   BMI 28.53 kg/m   Physical Exam Vitals and nursing note reviewed.  Constitutional:      General: She is not in acute distress.    Appearance: She is well-developed.  HENT:     Head: Normocephalic and atraumatic.     Left Ear: Tympanic membrane normal.     Ears:     Comments: Right-sided tympanic membrane perforation Eyes:     Conjunctiva/sclera: Conjunctivae normal.  Cardiovascular:     Rate and Rhythm: Normal rate and regular rhythm.     Heart sounds: No murmur heard. Pulmonary:     Effort: Pulmonary effort is normal. No respiratory distress.     Breath sounds: Normal breath sounds.  Abdominal:     Palpations: Abdomen is soft.     Tenderness: There is no abdominal tenderness.  Musculoskeletal:        General: No swelling.     Cervical back: Neck supple.  Skin:    General: Skin is warm and dry.     Capillary Refill: Capillary refill takes less than 2 seconds.  Neurological:     Mental Status: She is alert.  Psychiatric:        Mood and Affect: Mood normal.     ED Results and Treatments Labs (all labs ordered are listed, but only abnormal results are displayed) Labs Reviewed - No data to display                                                                                                                        Radiology No  results found.  Pertinent labs & imaging results that were available during my care of the patient were reviewed by me and considered in my medical decision making (see MDM for details).  Medications Ordered in ED Medications  ofloxacin (OCUFLOX) 0.3 % ophthalmic solution 5 drop (has no administration in time range)  naproxen (NAPROSYN) tablet 500 mg (has no administration in time range)                                                                                                                                      Procedures Procedures  (including critical care time)  Medical Decision Making / ED Course   This patient presents to the ED for concern of ear pain, hearing loss, this involves an extensive number of treatment options, and is a complaint that carries with it a high risk of complications and morbidity.  The differential diagnosis includes otitis media, otitis externa, tympanic membrane perforation, cerumen impaction, foreign body  MDM: Patient seen emergency room for evaluation of ear pain and hearing loss.  Physical exam with a small right tympanic membrane perforation with associated canal erythema but is otherwise unremarkable.  Left tympanic membrane normal.  Physical exam otherwise unremarkable.  Patient will be covered with ofloxacin and given Naprosyn for pain.  Outpatient ENT referral placed.  At this time she does not meet inpatient criteria for admission and will be discharged with outpatient follow-up   Additional history obtained:  -External records from outside source obtained and reviewed including: Chart review including previous notes, labs, imaging, consultation notes  Medicines ordered and prescription drug management: Meds ordered this encounter  Medications   ofloxacin (OCUFLOX) 0.3 % ophthalmic solution 5 drop   naproxen (NAPROSYN) tablet 500 mg   naproxen (NAPROSYN) 375 MG tablet    Sig: Take 1 tablet (375 mg total) by mouth 2 (two) times daily.    Dispense:  20 tablet    Refill:  0    -I have reviewed the patients home medicines and have made adjustments as needed  Critical interventions none   Social Determinants of Health:  Factors impacting patients care include: Spanish-speaking, interpreter used   Reevaluation: After the interventions noted above, I reevaluated the patient and found that they have :improved  Co morbidities that complicate the patient evaluation  Past Medical History:  Diagnosis Date   GERD  (gastroesophageal reflux disease)       Dispostion: I considered admission for this patient, but at this time she does not meet inpatient criteria for admission and will be discharged with outpatient follow-up     Final Clinical Impression(s) / ED Diagnoses Final diagnoses:  Perforation of right tympanic membrane     @PCDICTATION @    Glendora Score, MD 07/07/23 [phone]

## 2023-07-07 NOTE — ED Notes (Signed)
 Took tylenol 2345 pta

## 2023-07-07 NOTE — ED Triage Notes (Signed)
 Pt presents with right ear pain, headache, nausea for 2 days. Recent head cold 2 weeks ago. Seen by health dept, given an earwax removal kit and used without relief.

## 2023-09-03 ENCOUNTER — Encounter (INDEPENDENT_AMBULATORY_CARE_PROVIDER_SITE_OTHER): Payer: Self-pay

## 2023-09-03 ENCOUNTER — Ambulatory Visit (INDEPENDENT_AMBULATORY_CARE_PROVIDER_SITE_OTHER): Admitting: Otolaryngology

## 2023-09-03 VITALS — BP 115/76 | HR 74 | Wt 157.0 lb

## 2023-09-03 DIAGNOSIS — H9313 Tinnitus, bilateral: Secondary | ICD-10-CM | POA: Diagnosis not present

## 2023-09-03 DIAGNOSIS — H938X1 Other specified disorders of right ear: Secondary | ICD-10-CM

## 2023-09-03 DIAGNOSIS — H608X1 Other otitis externa, right ear: Secondary | ICD-10-CM

## 2023-09-03 DIAGNOSIS — H6121 Impacted cerumen, right ear: Secondary | ICD-10-CM | POA: Diagnosis not present

## 2023-09-03 MED ORDER — FLUTICASONE PROPIONATE 50 MCG/ACT NA SUSP: 2.0000 | Freq: Every day | NASAL | 6 refills | Status: AC

## 2023-09-03 MED ORDER — FLUOCINONIDE 0.05 % EX CREA
TOPICAL_CREAM | Freq: Two times a day (BID) | CUTANEOUS | 0 refills | Status: AC
Start: 2023-09-03 — End: 2023-09-17

## 2023-09-03 MED ORDER — FLUOCINONIDE 0.05 % EX CREA
TOPICAL_CREAM | Freq: Two times a day (BID) | CUTANEOUS | 0 refills | Status: DC
Start: 2023-09-03 — End: 2023-09-03

## 2023-09-03 MED ORDER — FLUTICASONE PROPIONATE 50 MCG/ACT NA SUSP: 2.0000 | Freq: Every day | NASAL | 6 refills | Status: DC

## 2023-09-03 NOTE — Patient Instructions (Addendum)
 Use flonase two sprays each nostril once per day Use steroid (fluocinonide) ointment (ear cream) using end of finger twice per day to just outside the right ear for 2 weeks

## 2023-09-03 NOTE — Progress Notes (Signed)
 Dear Dr. Jeannie Milo, Here is my assessment for our mutual patient, Stefanie Burgess. Thank you for allowing me the opportunity to care for your patient. Please do not hesitate to contact me should you have any other questions. Sincerely, Dr. Milon Aloe  Otolaryngology Clinic Note Referring provider: Dr. Jeannie Milo HPI:  Stefanie Burgess is a 43 y.o. female kindly referred by Dr. Jeannie Milo for evaluation of tympanic membrane perforation  Initial visit (08/2023):  In-person interpreted used: Stefanie Burgess Patient reports: she reports in February, she got a URI and in late feb, she started to have ear discomfort on right and fullness. She then went to the health department and got debrox drops, but it made her ear symptoms worse. She then went to the ED and was noted to have a perforation of the ear drum and given antibiotic drops which she did use (ofloxacin ). Current symptoms include mild ear itching, but pain has resolved, fullness much better. Some tinnitus in both ears. Hearing without issue.  Of note, She reports that since about 2011, she feels like she can "hear air" in right > left ears. No sensitivity. No history of ear infections. No significant loud noise exposure. No water  exposure  Patient denies: ear pain, fullness, vertigo, drainage Patient additionally denies: deep pain in ear canal, eustachian tube symptoms such as popping/crackling, sensitive to pressure changes Patient also denies barotrauma, vestibular suppressant use, ototoxic medication use Prior ear surgery: no No nasal issues including congestion, drainage.   H&N Surgery: no Personal or FHx of bleeding dz or anesthesia difficulty: no  GLP-1: no AP/AC: no  Tobacco: no.   PMHx: GERD  Independent Review of Additional Tests or Records:  Dr. Jeannie Milo (ED) 07/07/2023: noted right ear pain x2 days, used earwax removal kit, bulb flushed and then sudden worsening of pain and "whooshing" sound; no other sx; Dx: Right TM perforation; Rx: Ofloxacin , ref  to ENT CBC 12/08/2018: Eos 100; CMP 12/08/2018: BUN/Cr 10/0.57  PMH/Meds/All/SocHx/FamHx/ROS:   Past Medical History:  Diagnosis Date   GERD (gastroesophageal reflux disease)      Past Surgical History:  Procedure Laterality Date   BIOPSY  06/15/2019   Procedure: BIOPSY;  Surgeon: Suzette Espy, MD;  [facility];  Service: Endoscopy;;   CHOLECYSTECTOMY N/A 09/08/2019   Procedure: LAPAROSCOPIC CHOLECYSTECTOMY WITH INTRAOPERATIVE CHOLANGIOGRAM;  Surgeon: Awilda Bogus, MD;  Location: [facility] ORS;  Service: General;  Laterality: N/A;   ESOPHAGOGASTRODUODENOSCOPY N/A 06/15/2019   normal esophagus, gastritis s/p biopsy, negative H.pylori, normal duodenum.    lipoma removal     as child from arm    Family History  Problem Relation Age of Onset   Hyperlipidemia Mother    Colitis Mother    Hypertension Mother    Colon cancer Neg Hx    Colon polyps Neg Hx      Social Connections: Not on file      Current Outpatient Medications:    Multiple Vitamin (MULTIVITAMIN WITH MINERALS) TABS tablet, Take 1 tablet by mouth daily., Disp: , Rfl:    acetaminophen (TYLENOL) 500 MG tablet, Take 500 mg by mouth every 6 (six) hours as needed (for pain.). (Patient not taking: Reported on 09/03/2023), Disp: , Rfl:    alum & mag hydroxide-simeth (MAALOX/MYLANTA) 200-200-20 MG/5ML suspension, Take 30 mLs by mouth every 6 (six) hours as needed for indigestion or heartburn.  (Patient not taking: Reported on 09/03/2023), Disp: , Rfl:    calcium carbonate (TUMS - DOSED IN MG ELEMENTAL CALCIUM) 500 MG chewable tablet, Chew  1-2 tablets by mouth 3 (three) times daily as needed for indigestion or heartburn. (Patient not taking: Reported on 09/03/2023), Disp: , Rfl:    Camphor-Eucalyptus-Menthol (VICKS VAPORUB EX), Apply 1 application topically 4 (four) times daily as needed (congestion). (Patient not taking: Reported on 09/03/2023), Disp: , Rfl:    fluocinonide cream (LIDEX) 0.05 %, Apply topically 2 (two) times  daily for 14 days., Disp: 60 g, Rfl: 0   fluticasone (FLONASE) 50 MCG/ACT nasal spray, Place 2 sprays into both nostrils daily., Disp: 16 g, Rfl: 6   HEARTBURN RELIEF MAX ST 20 MG tablet, Take 20 mg by mouth at bedtime.  (Patient not taking: Reported on 09/03/2023), Disp: , Rfl:    Homeopathic Products (ARNICA EX), Apply 1 application topically 4 (four) times daily as needed (bruises). (Patient not taking: Reported on 09/03/2023), Disp: , Rfl:    naproxen  (NAPROSYN ) 375 MG tablet, Take 1 tablet (375 mg total) by mouth 2 (two) times daily. (Patient not taking: Reported on 09/03/2023), Disp: 20 tablet, Rfl: 0   Probiotic Product (PROBIOTIC PO), Take 1 capsule by mouth every other day. (Patient not taking: Reported on 09/03/2023), Disp: , Rfl:    simethicone  (GAS-X) 80 MG chewable tablet, Chew 80-160 mg by mouth every 6 (six) hours as needed for flatulence. (Patient not taking: Reported on 09/03/2023), Disp: , Rfl:    Physical Exam:   BP 115/76 (BP Location: Right Arm, Patient Position: Sitting, Cuff Size: Normal)   Pulse 74   Wt 157 lb (71.2 kg)   SpO2 98%   BMI 28.72 kg/m   Salient findings:  CN II-XII intact Given history and complaints, ear microscopy was indicated and performed for evaluation with findings as below in physical exam section and in procedures; Right cerumen impaction; after clearance, Bilateral EAC clear and TM intact with well pneumatized middle ear spaces, slight global retraction right Weber 512: mid Rinne 512: AC > BC b/l  Anterior rhinoscopy: Septum relatively midline; bilateral inferior turbinates without significant hypertrophy No lesions of oral cavity/oropharynx; dentition fair; mild TMJ crepitus b/l No obviously palpable neck masses/lymphadenopathy/thyromegaly No respiratory distress or stridor  Seprately Identifiable Procedures:  Prior to initiating any procedures, risks/benefits/alternatives were explained to the patient and verbal consent obtained. Procedure:  Bilateral ear microscopy and cerumen removal using microscope (CPT [phone]) - Mod 25 Pre-procedure diagnosis: Cerumen impaction right external ears Post-procedure diagnosis: same Indication: right cerumen impaction; given patient's otologic complaints and history as well as for improved and comprehensive examination of external ear and tympanic membrane, bilateral otologic examination using microscope was performed and impacted cerumen removed  Procedure: Patient was placed semi-recumbent. Both ear canals were examined using the microscope with findings above. Impacted Cerumen removed on right using suction and currette with improvement in EAC examination and patency. Patient tolerated the procedure well.      Impression & Plans:  Stefanie Burgess is a 43 y.o. female with:  1. Sensation of fullness in right ear   2. Bilateral tinnitus   3. 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 Impacted cerumen of right ear    Post viral (feb 2025) ear fullness, and discomfort. Now improving, but still reporting some right fullness. No effusion today. Based on sx, does appear to be ETD but does have some b/l tinnitus. Unclear what to make of "hearing air" since she has no other symptoms, but will check audio. Does also have some eczematoid change right EAC for which we will do steroid cream.  -  Start flonase BID - Fluocinonide cream right ear BID x2 weeks - f/u in 6-8 weeks with audio, sooner if necessary  See below regarding exact medications prescribed this encounter including dosages and route: Meds ordered this encounter  Medications   DISCONTD: fluticasone (FLONASE) 50 MCG/ACT nasal spray    Sig: Place 2 sprays into both nostrils daily.    Dispense:  16 g    Refill:  6   DISCONTD: fluocinonide cream (LIDEX) 0.05 %    Sig: Apply topically 2 (two) times daily for 14 days.    Dispense:  60 g    Refill:  0   fluocinonide cream (LIDEX) 0.05 %    Sig: Apply topically 2 (two) times daily  for 14 days.    Dispense:  60 g    Refill:  0   fluticasone (FLONASE) 50 MCG/ACT nasal spray    Sig: Place 2 sprays into both nostrils daily.    Dispense:  16 g    Refill:  6      Thank you for allowing me the opportunity to care for your patient. Please do not hesitate to contact me should you have any other questions.  Sincerely, Milon Aloe, MD Otolaryngologist (ENT), [facility] Phone: [phone] Fax: [phone]  09/03/2023, 9:30 AM   MDM:  Level 4 - [phone] Complexity/Problems addressed: mod - chronic problem, acute problem Data complexity: mod - independent review of notes, labs, ordering tests - Morbidity: mod  - Prescription Drug prescribed or managed: yes

## 2023-10-16 ENCOUNTER — Other Ambulatory Visit (HOSPITAL_COMMUNITY): Payer: Self-pay

## 2023-10-16 DIAGNOSIS — Z1231 Encounter for screening mammogram for malignant neoplasm of breast: Secondary | ICD-10-CM

## 2023-10-17 ENCOUNTER — Ambulatory Visit (INDEPENDENT_AMBULATORY_CARE_PROVIDER_SITE_OTHER): Admitting: Audiology

## 2023-10-17 ENCOUNTER — Ambulatory Visit (INDEPENDENT_AMBULATORY_CARE_PROVIDER_SITE_OTHER): Admitting: Otolaryngology

## 2023-10-27 ENCOUNTER — Ambulatory Visit (HOSPITAL_COMMUNITY)
Admission: RE | Admit: 2023-10-27 | Discharge: 2023-10-27 | Disposition: A | Discharge status: Home / Self Care | Source: Ambulatory Visit | Attending: Nurse Practitioner | Admitting: Nurse Practitioner

## 2023-10-27 DIAGNOSIS — Z1231 Encounter for screening mammogram for malignant neoplasm of breast: Secondary | ICD-10-CM | POA: Diagnosis present

## 2023-11-28 ENCOUNTER — Ambulatory Visit (HOSPITAL_COMMUNITY)
Admission: RE | Admit: 2023-11-28 | Discharge: 2023-11-28 | Disposition: A | Discharge status: Home / Self Care | Source: Ambulatory Visit | Attending: Physician Assistant | Admitting: Physician Assistant

## 2023-11-28 ENCOUNTER — Other Ambulatory Visit (HOSPITAL_COMMUNITY): Payer: Self-pay | Admitting: Physician Assistant

## 2023-11-28 ENCOUNTER — Encounter (HOSPITAL_COMMUNITY): Payer: Self-pay | Admitting: Physician Assistant

## 2023-11-28 DIAGNOSIS — R52 Pain, unspecified: Secondary | ICD-10-CM | POA: Diagnosis present

## 2023-12-18 ENCOUNTER — Encounter (INDEPENDENT_AMBULATORY_CARE_PROVIDER_SITE_OTHER): Payer: Self-pay | Admitting: Otolaryngology

## 2023-12-18 ENCOUNTER — Ambulatory Visit (INDEPENDENT_AMBULATORY_CARE_PROVIDER_SITE_OTHER): Admitting: Audiology

## 2023-12-18 ENCOUNTER — Ambulatory Visit (INDEPENDENT_AMBULATORY_CARE_PROVIDER_SITE_OTHER): Admitting: Otolaryngology

## 2023-12-18 VITALS — BP 125/84 | HR 69 | Ht 62.0 in | Wt 159.0 lb

## 2023-12-18 DIAGNOSIS — H608X1 Other otitis externa, right ear: Secondary | ICD-10-CM

## 2023-12-18 DIAGNOSIS — H9313 Tinnitus, bilateral: Secondary | ICD-10-CM | POA: Diagnosis not present

## 2023-12-18 DIAGNOSIS — H938X1 Other specified disorders of right ear: Secondary | ICD-10-CM

## 2023-12-18 DIAGNOSIS — Z0110 Encounter for examination of ears and hearing without abnormal findings: Secondary | ICD-10-CM | POA: Diagnosis not present

## 2023-12-18 DIAGNOSIS — H93299 Other abnormal auditory perceptions, unspecified ear: Secondary | ICD-10-CM

## 2023-12-18 NOTE — Progress Notes (Signed)
 Dear Dr. Catharine, Here is my assessment for our mutual patient, Stefanie Burgess. Thank you for allowing me the opportunity to care for your patient. Please do not hesitate to contact me should you have any other questions. Sincerely, Dr. Eldora Blanch  Otolaryngology Clinic Note Referring provider: Dr. Catharine HPI:  Stefanie Burgess is a 43 y.o. female kindly referred by Dr. Catharine for evaluation of tympanic membrane perforation  Initial visit (08/2023):  Patient reports: she reports in February, she got a URI and in late feb, she started to have ear discomfort on right and fullness. She then went to the [facility] and got debrox drops, but it made her ear symptoms worse. She then went to the ED and was noted to have a perforation of the ear drum and given antibiotic drops which she did use (ofloxacin ). Current symptoms include mild ear itching, but pain has resolved, fullness much better. Some tinnitus in both ears. Hearing without issue.  Of note, She reports that since about 2011, she feels like she can hear air in right > left ears. No sensitivity. No history of ear infections. No significant loud noise exposure. No water  exposure  Patient denies: ear pain, fullness, vertigo, drainage Patient additionally denies: deep pain in ear canal, eustachian tube symptoms such as popping/crackling, sensitive to pressure changes Patient also denies barotrauma, vestibular suppressant use, ototoxic medication use Prior ear surgery: no No nasal issues including congestion, drainage.   --------------------------------------------------------- 12/18/2023 She reports that she is doing well overall. She reports no ear fullness, pain, drainage, vertigo. She reports that she still hears air in her ear at night/when it is quiet (describes more as hissing) but this is much better as well.  In person interpreter: Hadassah - used  H&N Surgery: no Personal or FHx of bleeding dz or anesthesia difficulty: no  GLP-1:  no AP/AC: no  Tobacco: no.   PMHx: GERD  Independent Review of Additional Tests or Records:  Dr. 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 hearing loss   PMH/Meds/All/SocHx/FamHx/ROS:   Past Medical History:  Diagnosis Date   GERD (gastroesophageal reflux disease)      Past Surgical History:  Procedure Laterality Date   BIOPSY  06/15/2019   Procedure: BIOPSY;  Surgeon: Shaaron Lamar HERO, MD;  Location: [facility];  Service: Endoscopy;;   CHOLECYSTECTOMY N/A 09/08/2019   Procedure: LAPAROSCOPIC CHOLECYSTECTOMY WITH INTRAOPERATIVE CHOLANGIOGRAM;  Surgeon: Kallie Manuelita BROCKS, MD;  Location: [facility];  Service: General;  Laterality: N/A;   ESOPHAGOGASTRODUODENOSCOPY N/A 06/15/2019   normal esophagus, gastritis s/p biopsy, negative H.pylori, normal duodenum.    lipoma removal     as child from arm    Family History  Problem Relation Age of Onset   Hyperlipidemia Mother    Colitis Mother    Hypertension Mother    Colon cancer Neg Hx    Colon polyps Neg Hx      Social Connections: Not on file      Current Outpatient Medications:    fluticasone  (FLONASE ) 50 MCG/ACT nasal spray, Place 2 sprays into both nostrils daily., Disp: 16 g, Rfl: 6  Multiple Vitamin (MULTIVITAMIN WITH MINERALS) TABS tablet, Take 1 tablet by mouth daily., Disp: , Rfl:    acetaminophen (TYLENOL) 500 MG tablet, Take 500 mg by mouth every 6 (six) hours as needed (for pain.). (Patient not taking: Reported on 12/18/2023), Disp: , Rfl:    alum & mag hydroxide-simeth (MAALOX/MYLANTA) 200-200-20 MG/5ML suspension, Take 30 mLs by mouth  every 6 (six) hours as needed for indigestion or heartburn.  (Patient not taking: Reported on 12/18/2023), Disp: , Rfl:    calcium carbonate (TUMS - DOSED IN MG ELEMENTAL CALCIUM) 500 MG chewable tablet, Chew 1-2 tablets by mouth 3 (three) times daily as needed for indigestion or heartburn. (Patient not taking: Reported on 12/18/2023), Disp: , Rfl:    Camphor-Eucalyptus-Menthol (VICKS VAPORUB EX), Apply 1 application topically 4 (four) times daily as needed (congestion). (Patient not taking: Reported on 12/18/2023), Disp: , Rfl:    HEARTBURN RELIEF MAX ST 20 MG tablet, Take 20 mg by mouth at bedtime.  (Patient not taking: Reported on 12/18/2023), Disp: , Rfl:    Homeopathic Products (ARNICA EX), Apply 1 application topically 4 (four) times daily as needed (bruises). (Patient not taking: Reported on 12/18/2023), Disp: , Rfl:    naproxen  (NAPROSYN ) 375 MG tablet, Take 1 tablet (375 mg total) by mouth 2 (two) times daily. (Patient not taking: Reported on 12/18/2023), Disp: 20 tablet, Rfl: 0   Probiotic Product (PROBIOTIC PO), Take 1 capsule by mouth every other day. (Patient not taking: Reported on 12/18/2023), Disp: , Rfl:    simethicone  (GAS-X) 80 MG chewable tablet, Chew 80-160 mg by mouth every 6 (six) hours as needed for flatulence. (Patient not taking: Reported on 12/18/2023), Disp: , Rfl:    Physical Exam:   BP 125/84 (BP Location: Right Arm, Patient Position: Sitting, Cuff Size: Normal)   Pulse 69   Ht 5' 2 (1.575 m)   Wt 159 lb (72.1 kg)   SpO2 98%   BMI 29.08 kg/m   Salient findings:  CN II-XII intact  Bilateral EAC clear and TM intact with well pneumatized middle ear spaces, slight global retraction right; eczematoid change much improved. Weber 512: mid Rinne 512: AC > BC b/l  Anterior rhinoscopy: Septum relatively midline; bilateral inferior turbinates without significant hypertrophy No respiratory distress or stridor  Seprately Identifiable Procedures:  Prior to initiating any  procedures, risks/benefits/alternatives were explained to the patient and verbal consent obtained. None today     Impression & Plans:  Stefanie Burgess is a 43 y.o. female with:  1. Sensation of fullness in right ear   2. Bilateral tinnitus   3. 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 Normal hearing test of both ears    Post viral (feb 2025) ear fullness, and discomfort. Now resolved. Itching also resolved. She is overall doing much better after flonase  and fluocinonide . The air hearing she now describes more as hissing which is much better and appears to be most consistent with tinnitus. We discussed management of this and she opted for masking at night.  - Given improvement, discontinue flonase  BID - Can use Fluocinonide  cream BID for 1 week if itching returns F/u PRN     Thank you for allowing me the opportunity to care for your patient. Please do not hesitate to contact me should you have any other questions.  Sincerely, Eldora Blanch, MD Otolaryngologist (ENT), [facility] Phone: [phone] Fax: [phone]  12/18/2023, 4:20 PM   MDM:  Level 4 - 99214 Complexity/Problems addressed: mod -  chronic problems Data complexity: low - Morbidity: mod  - Prescription Drug prescribed or managed: y - d/c flonase .

## 2023-12-18 NOTE — Progress Notes (Signed)
  [address], [address] [place], [place] [address] [phone]  Audiological Evaluation    Name: Stefanie Burgess     DOB:   1980/09/19      MRN:   [id number]                                                                                     Service Date: 12/18/2023     Accompanied by: unaccompanied  Patient was tested by Spanish speaking audiologist.  Patient comes today after Dr. Tobie, ENT sent a referral for a hearing evaluation due to concerns with hearing loss.   Symptoms Yes Details  Hearing loss  []    Tinnitus  [x]  Air sound , noticeable at night - comes form her head  Ear pain/ infections/pressure  []  Reported ear pains as a child  Balance problems  []    Noise exposure history  []    Previous ear surgeries  []    Family history of hearing loss  []    Amplification  []    Other  []      Otoscopy: Right ear: Clear external ear canal and notable landmarks visualized on the tympanic membrane. Left ear:  Clear external ear canal and notable landmarks visualized on the tympanic membrane.  Tympanometry: Right ear: Type A- Normal external ear canal volume with normal middle ear pressure and tympanic membrane compliance. Left ear: Type A- Normal external ear canal volume with normal middle ear pressure and tympanic membrane compliance.  Pure tone Audiometry: Normal hearing from 125 Hz - 8000 Hz.  Speech Audiometry: Right ear- Speech Reception Threshold (SRT) was obtained at 10 dBHL. Left ear-Speech Reception Threshold (SRT) was obtained at 10 dBHL.   Word Recognition Score Tested using Spanish list (recorded) Right ear: 100% was obtained at a presentation level of 50 dBHL with contralateral masking which is deemed as  excellent. Left ear: 100% was obtained at a presentation level of 50 dBHL with contralateral masking which is deemed as  excellent.   The hearing test results were completed under headphones and results are deemed to be of good reliability. Test technique:   conventional     Recommendations: Follow up with ENT as scheduled for today. Return for a hearing evaluation if concerns with hearing changes arise or per MD recommendation. Consider various tinnitus strategies, including the use of a sound generator, hearing aids, and/or tinnitus retraining therapy.    Lakiyah Arntson MARIE LEROUX-MARTINEZ, AUD

## 2024-01-12 ENCOUNTER — Ambulatory Visit (HOSPITAL_COMMUNITY)
Admission: RE | Admit: 2024-01-12 | Discharge: 2024-01-12 | Disposition: A | Discharge status: Home / Self Care | Source: Ambulatory Visit | Attending: Physician Assistant | Admitting: Physician Assistant

## 2024-01-12 ENCOUNTER — Other Ambulatory Visit (HOSPITAL_COMMUNITY): Payer: Self-pay | Admitting: Physician Assistant

## 2024-01-12 DIAGNOSIS — M79674 Pain in right toe(s): Secondary | ICD-10-CM | POA: Diagnosis present
